# Patient Record
Sex: Male | Born: 1990
Health system: Southern US, Community
[De-identification: ages and names within clinical notes are randomized; demographics above are authoritative.]

## PROBLEM LIST (undated history)

## (undated) DIAGNOSIS — S82209A Unspecified fracture of shaft of unspecified tibia, initial encounter for closed fracture: Secondary | ICD-10-CM

## (undated) DIAGNOSIS — F909 Attention-deficit hyperactivity disorder, unspecified type: Secondary | ICD-10-CM

## (undated) HISTORY — DX: Unspecified fracture of shaft of unspecified tibia, initial encounter for closed fracture: S82.209A

## (undated) HISTORY — PX: INGUINAL HERNIA REPAIR: SHX194

## (undated) HISTORY — DX: Attention-deficit hyperactivity disorder, unspecified type: F90.9

## (undated) HISTORY — PX: WISDOM TOOTH EXTRACTION: SHX21

---

## 2011-03-10 ENCOUNTER — Ambulatory Visit (INDEPENDENT_AMBULATORY_CARE_PROVIDER_SITE_OTHER): Payer: BC Managed Care – PPO | Admitting: Family Medicine

## 2011-03-10 ENCOUNTER — Encounter: Payer: Self-pay | Admitting: Family Medicine

## 2011-03-10 ENCOUNTER — Telehealth: Payer: Self-pay | Admitting: Family Medicine

## 2011-03-10 VITALS — BP 144/90 | HR 76 | Temp 98.4°F | Ht 67.5 in | Wt 158.0 lb

## 2011-03-10 DIAGNOSIS — H60509 Unspecified acute noninfective otitis externa, unspecified ear: Secondary | ICD-10-CM | POA: Insufficient documentation

## 2011-03-10 DIAGNOSIS — H60399 Other infective otitis externa, unspecified ear: Secondary | ICD-10-CM

## 2011-03-10 MED ORDER — OFLOXACIN 0.3 % OT SOLN: OTIC | Status: DC

## 2011-03-10 NOTE — Assessment & Plan Note (Signed)
Start floxin otic 10 drops once daily x 10d.  Take tylenol 1000 mg q6h prn.

## 2011-03-10 NOTE — Progress Notes (Signed)
Office Note 03/10/2011  CC:  Chief Complaint  Patient presents with  . new patient- right ear pain x 5 days    HPI:  Lawrence Mendoza is a 20 y.o. White male who is here to establish care and discuss right ear pain. Patient's most recent primary MD: [facility] in [place], [place] (last visit there was years ago per pt report). Old records were not reviewed prior to or during today's visit.  Pt complains of several days of right ear pain, was intermittent but now persistent/constant.  No drainage.  No nasal congestion or runny nose, no sneezing, no cough.  Has some intermittent frontal HAs that he's not sure are related to his ear sx's.  No ST but hurts about on the right side of neck near the angle of his jaw.   No fevers.  Took tylenol this am and it helped temporarily.  Past Medical History  Diagnosis Date  . ADHD (attention deficit hyperactivity disorder)     as school age child, was on adderall xr until age 76.    Past Surgical History  Procedure Date  . Inguinal hernia repair     Right side--as an infant.    Family History  Problem Relation Age of Onset  . Hyperlipidemia Father     History   Social History  . Marital Status: Single    Spouse Name: N/A    Number of Children: N/A  . Years of Education: N/A   Occupational History  . Not on file.   Social History Main Topics  . Smoking status: Former Games developer  . Smokeless tobacco: Not on file  . Alcohol Use: Not on file  . Drug Use: Not on file  . Sexually Active: Not on file   Other Topics Concern  . Not on file   Social History Narrative   Single, no children.Attends [employer] in [place].Works at [employer]Smoked cigarettes for a few years on and off, quit for good 07/2010.No alcohol or drug use.    Outpatient Encounter Prescriptions as of 03/10/2011  Medication Sig Dispense Refill  . ofloxacin (FLOXIN) 0.3 % otic solution 10 drops in right ear once daily for 10 days  10 mL  1      No Known Allergies  ROS Review of Systems  Constitutional: Negative for fever, chills, appetite change and fatigue.  HENT: Negative for ear pain, congestion, sore throat, neck stiffness and dental problem.   Eyes: Negative for discharge, redness and visual disturbance.  Respiratory: Negative for cough, chest tightness, shortness of breath and wheezing.   Cardiovascular: Negative for chest pain, palpitations and leg swelling.  Gastrointestinal: Negative for nausea, vomiting, abdominal pain, diarrhea and blood in stool.  Genitourinary: Negative for dysuria, urgency, frequency, hematuria, flank pain and difficulty urinating.  Musculoskeletal: Negative for myalgias, back pain, joint swelling and arthralgias.  Skin: Negative for pallor and rash.       He says a few of his moles in the neck area bother him --his shirts irritate them and he is embarrassed by their appearance.  Neurological: Negative for dizziness, speech difficulty, weakness and headaches.  Hematological: Negative for adenopathy. Does not bruise/bleed easily.  Psychiatric/Behavioral: Negative for confusion and sleep disturbance. The patient is not nervous/anxious.     PE; Blood pressure 144/90, pulse 76, temperature 98.4 F (36.9 C), temperature source Oral, height 5' 7.5" (1.715 m), weight 158 lb (71.668 kg). Gen: Alert, well appearing.  Patient is oriented to person, place, time, and situation. HEENT:  Scalp without lesions or hair loss.  Ears: Left side EAC clear, normal epithelium, TM without erythema or bulging.  Right side: TTP near tragus and with insertion of ear speculum.  Right EAC erythematous and this extends down to the base of the TM.  The TM is a bit hazy but no erythema and no bulging.  Eyes: no injection, icteris, swelling, or exudate.  EOMI, PERRLA. Nose: no drainage or turbinate edema/swelling.  No injection or focal lesion.  Mouth: lips without lesion/swelling.  Oral mucosa pink and moist.  Dentition intact  and without obvious caries or gingival swelling.  Oropharynx without erythema, exudate, or swelling.  Neck: supple, ROM full.  No lymphadenopathy, thyromegaly, or mass.  Right side mildly tender near angle of mandible--no mass palpable.  Skin is normal here.  Pertinent labs:  none  ASSESSMENT AND PLAN:  New patient:   Acute otitis externa Start floxin otic 10 drops once daily x 10d.  Take tylenol 1000 mg q6h prn.      Return for make 30 min appt for CPE at your convenience.  Will evaluate his moles and come up with a plan for removal of the ones that bother him (or if any look atypical).

## 2011-03-10 NOTE — Telephone Encounter (Signed)
Pls request records from [facility] practice in [place]. Thx--PM.

## 2011-04-17 NOTE — Telephone Encounter (Signed)
Faxed 04/17/11 °

## 2011-05-11 ENCOUNTER — Ambulatory Visit (INDEPENDENT_AMBULATORY_CARE_PROVIDER_SITE_OTHER): Payer: BC Managed Care – PPO | Admitting: Family Medicine

## 2011-05-11 ENCOUNTER — Encounter: Payer: Self-pay | Admitting: Family Medicine

## 2011-05-11 VITALS — BP 124/82 | HR 78 | Temp 98.2°F | Ht 67.5 in | Wt 157.0 lb

## 2011-05-11 DIAGNOSIS — Z0000 Encounter for general adult medical examination without abnormal findings: Secondary | ICD-10-CM

## 2011-05-11 NOTE — Progress Notes (Signed)
Office Note 05/11/2011  CC:  Chief Complaint  Patient presents with  . Annual Exam    no problems    HPI:  Lawrence Mendoza is a 20 y.o. White male who is here for CPE. No complaints.  Outer ear infection he had 6+ wks ago cleared up quickly with drops.  Has a few moles that are around his neck and on his back that he wants removed.  These have not changed any recently, they just bother him/embarrass him.  Eats lots of fruits/veggies, exercises daily.   Past Medical History  Diagnosis Date  . ADHD (attention deficit hyperactivity disorder)     as school age child, was on adderall xr until age 83.    Past Surgical History  Procedure Date  . Inguinal hernia repair     Right side--as an infant.    Family History  Problem Relation Age of Onset  . Hyperlipidemia Father   . Hyperlipidemia Paternal Grandfather     History   Social History  . Marital Status: Single    Spouse Name: N/A    Number of Children: N/A  . Years of Education: N/A   Occupational History  . Not on file.   Social History Main Topics  . Smoking status: Former Games developer  . Smokeless tobacco: Not on file  . Alcohol Use: Not on file  . Drug Use: Not on file  . Sexually Active: Not on file   Other Topics Concern  . Not on file   Social History Narrative   Single, no children.Attends [employer] in [employer].Works at [employer]Smoked cigarettes for a few years on and off, quit for good 07/2010.No alcohol or drug use.   CURRENT MEDS: none Outpatient Prescriptions Prior to Visit  Medication Sig Dispense Refill  . ofloxacin (FLOXIN) 0.3 % otic solution 10 drops in right ear once daily for 10 days  10 mL  1    No Known Allergies  ROS Review of Systems  Constitutional: Negative for fever, chills, appetite change and fatigue.  HENT: Negative for ear pain, congestion, sore throat, neck stiffness and dental problem.   Eyes: Negative for discharge, redness and  visual disturbance.  Respiratory: Negative for cough, chest tightness, shortness of breath and wheezing.   Cardiovascular: Negative for chest pain, palpitations and leg swelling.  Gastrointestinal: Negative for nausea, vomiting, abdominal pain, diarrhea and blood in stool.  Genitourinary: Negative for dysuria, urgency, frequency, hematuria, flank pain and difficulty urinating.  Musculoskeletal: Negative for myalgias, back pain, joint swelling and arthralgias.  Skin: Negative for pallor and rash.  Neurological: Negative for dizziness, speech difficulty, weakness and headaches.  Hematological: Negative for adenopathy. Does not bruise/bleed easily.  Psychiatric/Behavioral: Negative for confusion and sleep disturbance. The patient is not nervous/anxious.     PE; Blood pressure 124/82, pulse 78, temperature 98.2 F (36.8 C), temperature source Oral, height 5' 7.5" (1.715 m), weight 157 lb (71.215 kg), SpO2 98.00%. Gen: Alert, well appearing.  Patient is oriented to person, place, time, and situation. HEENT: Scalp without lesions or hair loss.  Ears: EACs clear, normal epithelium.  TMs with good light reflex and landmarks bilaterally.  Eyes: no injection, icteris, swelling, or exudate.  EOMI, PERRLA. Nose: no drainage or turbinate edema/swelling.  No injection or focal lesion.  Mouth: lips without lesion/swelling.  Oral mucosa pink and moist.  Dentition intact and without obvious caries or gingival swelling.  Oropharynx without erythema, exudate, or swelling.  Neck: supple, ROM full.  Carotids 2+  bilat, without bruit.  No lymphadenopathy, thyromegaly, or mass. Chest: symmetric expansion, nonlabored respirations.  Clear and equal breath sounds in all lung fields.   CV: RRR, no m/r/g.  Peripheral pulses 2+ and symmetric. ABD: soft, NT, ND, BS normal.  No hepatospenomegaly or mass.  No bruits. EXT: no clubbing, cyanosis, or edema.  Genitals normal; both testes normal without tenderness, masses,  hydroceles, varicoceles, erythema or swelling. Shaft normal, circumcised, meatus normal without discharge. No inguinal hernia noted. No inguinal lymphadenopathy. SKIN: multiple small brown nevi around base of neck and on back.  No atypical appearance to any of these.  Pertinent labs:  none  ASSESSMENT AND PLAN:   Health maintenance examination Healthy young adult, doing well. Reviewed age and gender appropriate health maintenance issues (prudent diet, regular exercise, health risks of tobacco and excessive alcohol, use of seatbelts, fire alarms in home, use of sunscreen).  Also reviewed age and gender appropriate health screening as well as vaccine recommendations. He declined flu vaccine today.  Last tetanus was a few years ago in HS.   For his moles, I recommended he see a dermatologist for consideration of removal of these. He will call early 2013 when he is ready for his skin doctor referral.  FOLLOW UP:  Return in about 2 years (around 05/10/2013) for cpe.

## 2011-05-11 NOTE — Assessment & Plan Note (Signed)
Healthy young adult, doing well. Reviewed age and gender appropriate health maintenance issues (prudent diet, regular exercise, health risks of tobacco and excessive alcohol, use of seatbelts, fire alarms in home, use of sunscreen).  Also reviewed age and gender appropriate health screening as well as vaccine recommendations. He declined flu vaccine today.  Last tetanus was a few years ago in HS.

## 2012-08-30 ENCOUNTER — Ambulatory Visit (INDEPENDENT_AMBULATORY_CARE_PROVIDER_SITE_OTHER): Payer: BC Managed Care – PPO | Admitting: Internal Medicine

## 2012-08-30 ENCOUNTER — Encounter: Payer: Self-pay | Admitting: Internal Medicine

## 2012-08-30 VITALS — BP 130/78 | HR 77 | Temp 97.8°F | Ht 67.5 in | Wt 161.0 lb

## 2012-08-30 DIAGNOSIS — J209 Acute bronchitis, unspecified: Secondary | ICD-10-CM

## 2012-08-30 MED ORDER — AZITHROMYCIN 250 MG PO TABS: ORAL_TABLET | ORAL | Status: DC

## 2012-08-30 NOTE — Patient Instructions (Signed)

## 2012-08-30 NOTE — Progress Notes (Signed)
HPI  Pt presents to the clinic today with c/o cough x 3 weeks. He has been running fevers and producing thick green sputum. He has not taken anything OTC for his symptoms. The sputum seems to be getting thicker. He does not have any history of allergies or asthma. He has had sick contacts.  Review of Systems    Past Medical History  Diagnosis Date  . ADHD (attention deficit hyperactivity disorder)     as school age child, was on adderall xr until age 22.    Family History  Problem Relation Age of Onset  . Hyperlipidemia Father   . Hyperlipidemia Paternal Grandfather     History   Social History  . Marital Status: Single    Spouse Name: N/A    Number of Children: N/A  . Years of Education: N/A   Occupational History  . Not on file.   Social History Main Topics  . Smoking status: Former Games developer  . Smokeless tobacco: Not on file  . Alcohol Use: Not on file  . Drug Use: Not on file  . Sexually Active: Not on file   Other Topics Concern  . Not on file   Social History Narrative   Single, no children.Attends [employer] in [employer].Works at [employer]Smoked cigarettes for a few years on and off, quit for good 07/2010.No alcohol or drug use.    No Known Allergies   Constitutional: Positive fatigue and fever. Denies headache, abrupt weight changes.  HEENT: . Denies eye redness, ear pain, ringing in the ears, wax buildup, runny nose or bloody nose. Respiratory: Positive cough and thick green sputum production. Denies difficulty breathing or shortness of breath.  Cardiovascular: Denies chest pain, chest tightness, palpitations or swelling in the hands or feet.   No other specific complaints in a complete review of systems (except as listed in HPI above).  Objective:    BP 130/78  Pulse 77  Temp 97.8 F (36.6 C) (Oral)  Ht 5' 7.5" (1.715 m)  Wt 161 lb (73.029 kg)  BMI 24.84 kg/m2  SpO2 97% Wt Readings from Last 3 Encounters:   08/30/12 161 lb (73.029 kg)  05/11/11 157 lb (71.215 kg)  03/10/11 158 lb (71.668 kg)    General: Appears his stated age, well developed, well nourished in NAD. HEENT: Head: normal shape and size; Eyes: sclera white, no icterus, conjunctiva pink, PERRLA and EOMs intact; Ears: Tm's gray and intact, normal light reflex; Nose: mucosa pink and moist, septum midline; Throat/Mouth: + PND. Teeth present, mucosa pink and moist, no exudate noted, no lesions or ulcerations noted.  Neck: Mild cervical lymphadenopathy. Neck supple, trachea midline. No massses, lumps or thyromegaly present.  Cardiovascular: Normal rate and rhythm. S1,S2 noted.  No murmur, rubs or gallops noted. No JVD or BLE edema. No carotid bruits noted. Pulmonary/Chest: Normal effort and fine ronchi throughout. No respiratory distress. No wheezes, rales noted.      Assessment & Plan:   Acute bronchitis, new onset with additional workup required:  Get some rest and stay well hydrated Azithromax x 5 days  RTC as needed or if symptoms persist.

## 2013-05-12 ENCOUNTER — Encounter: Payer: BC Managed Care – PPO | Admitting: Family Medicine

## 2013-10-22 DIAGNOSIS — S82209A Unspecified fracture of shaft of unspecified tibia, initial encounter for closed fracture: Secondary | ICD-10-CM

## 2013-10-22 HISTORY — DX: Unspecified fracture of shaft of unspecified tibia, initial encounter for closed fracture: S82.209A

## 2013-10-23 ENCOUNTER — Ambulatory Visit (INDEPENDENT_AMBULATORY_CARE_PROVIDER_SITE_OTHER): Payer: 59 | Admitting: Family Medicine

## 2013-10-23 ENCOUNTER — Telehealth: Payer: Self-pay | Admitting: Family Medicine

## 2013-10-23 ENCOUNTER — Encounter: Payer: Self-pay | Admitting: Family Medicine

## 2013-10-23 VITALS — BP 131/87 | HR 99 | Temp 100.2°F | Resp 18 | Ht 68.0 in | Wt 168.0 lb

## 2013-10-23 DIAGNOSIS — J029 Acute pharyngitis, unspecified: Secondary | ICD-10-CM

## 2013-10-23 LAB — POCT RAPID STREP A (OFFICE): RAPID STREP A SCREEN: NEGATIVE

## 2013-10-23 MED ORDER — AMOXICILLIN 875 MG PO TABS: 875.0000 mg | ORAL_TABLET | Freq: Two times a day (BID) | ORAL | Status: AC

## 2013-10-23 NOTE — Progress Notes (Signed)
Pre visit review using our clinic review tool, if applicable. No additional management support is needed unless otherwise documented below in the visit note. 

## 2013-10-23 NOTE — Progress Notes (Signed)
OFFICE NOTE  10/23/2013  CC:  Chief Complaint  Patient presents with  . Sore Throat    x Tuesday  . Fever  . Nasal Congestion     HPI: Patient is a 23 y.o. Caucasian male who is here for ST. Onset 2 d/a, some nasal congestion/mucous with it, some cough, stomach queezy, fever the first night.  No rash, no diarrhea.  Taking advil 600mg  twice daily and nyquil.  Generalized fatigue but no aches.   Pertinent PMH:  Past medical, surgical, social, and family history reviewed and no changes are noted since last office visit.  MEDS:  As per HPI  PE: Blood pressure 131/87, pulse 99, temperature 100.2 F (37.9 C), temperature source Temporal, resp. rate 18, height 5\' 8"  (1.727 m), weight 168 lb (76.204 kg), SpO2 99.00%. Gen: Alert, tired appearing but nontoxic. VS: noted--normal. Gen: alert, NAD, NONTOXIC APPEARING. HEENT: eyes without injection, drainage, or swelling.  Ears: EACs clear, TMs with mild dullness/loss of landmarks with minimal erythema/injection.  No bulging, no obvious middle ear fluid noted. Nose: Scant clear rhinorrhea, with no significant mucosal edema or injection.  No paranasal sinus TTP.  No facial swelling.  Throat and mouth without focal lesion.  +Diffuse mild pharyngeal swelling, symmetric tonsillar enlargement and erythema, mild exudate in tonsillar crypts.   Neck: supple, with L>R anterior cervical LAD, no signif TTP   LUNGS: CTA bilat, nonlabored resps.   CV: RRR, no m/r/g. EXT: no c/c/e SKIN: no rash  LAB: rapid strep NEG today  IMPRESSION AND PLAN:  Acute pharyngitis, fever. Even though rapid strep neg, suspicious for streptococcal pharyngitis. Will send throat culture and start amoxil 875mg  bid x 10d. Signs/symptoms to call or return for were reviewed and pt expressed understanding.  An After Visit Summary was printed and given to the patient.  FOLLOW UP: prn

## 2013-10-23 NOTE — Telephone Encounter (Signed)
Please write a note to excuse him from work for yesterday & today. Patient will pick up this morning

## 2013-10-25 LAB — CULTURE, GROUP A STREP: ORGANISM ID, BACTERIA: NORMAL

## 2013-10-31 ENCOUNTER — Encounter: Payer: Self-pay | Admitting: Family Medicine

## 2014-07-13 ENCOUNTER — Encounter: Payer: Self-pay | Admitting: Family Medicine

## 2014-07-13 ENCOUNTER — Ambulatory Visit (INDEPENDENT_AMBULATORY_CARE_PROVIDER_SITE_OTHER): Payer: 59 | Admitting: Family Medicine

## 2014-07-13 ENCOUNTER — Telehealth: Payer: Self-pay | Admitting: Family Medicine

## 2014-07-13 VITALS — BP 136/89 | HR 71 | Temp 99.2°F | Resp 16 | Ht 68.0 in | Wt 170.0 lb

## 2014-07-13 DIAGNOSIS — D229 Melanocytic nevi, unspecified: Secondary | ICD-10-CM

## 2014-07-13 DIAGNOSIS — D239 Other benign neoplasm of skin, unspecified: Secondary | ICD-10-CM

## 2014-07-13 DIAGNOSIS — Z0000 Encounter for general adult medical examination without abnormal findings: Secondary | ICD-10-CM

## 2014-07-13 NOTE — Assessment & Plan Note (Signed)
Reviewed age and gender appropriate health maintenance issues (prudent diet, regular exercise, health risks of tobacco and excessive alcohol, use of seatbelts, fire alarms in home, use of sunscreen).  Also reviewed age and gender appropriate health screening as well as vaccine recommendations. No labs indicated. He declined flu vaccine. Td UTD. Will refer to dermatology for general annual skin exam since he has quite a few moles, one on right low back that is very large.

## 2014-07-13 NOTE — Progress Notes (Signed)
Office Note 07/13/2014  CC:  Chief Complaint  Patient presents with  . Annual Exam   HPI:  Lawrence Mendoza is a 23 y.o. White male who is here for CPE.   Doesn't workout but walks a lot on his job. Dietary habits are decent. Working long hours on his new job.  Past Medical History  Diagnosis Date  . ADHD (attention deficit hyperactivity disorder)     as school age child, was on adderall xr until age 12.  . Tibia fracture 10/2013    compound, not displaced.  No surgery.    Past Surgical History  Procedure Laterality Date  . Inguinal hernia repair      Right side--as an infant.  . Wisdom tooth extraction      Family History  Problem Relation Age of Onset  . Hyperlipidemia Father   . Hypertension Father   . Hyperlipidemia Paternal Grandfather     History   Social History  . Marital Status: Single    Spouse Name: N/A    Number of Children: N/A  . Years of Education: N/A   Occupational History  . Not on file.   Social History Main Topics  . Smoking status: Current Some Day Smoker  . Smokeless tobacco: Former Systems developer  . Alcohol Use: Yes     Comment: socially  . Drug Use: No  . Sexual Activity: Not on file   Other Topics Concern  . Not on file   Social History Narrative   Single, no children.   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   Lives in [place], [place].   Smoked cigarettes for a few years on and off, quit for good 07/2010.   No alcohol or drug use.   MEDS: none  No Known Allergies  ROS Review of Systems  Constitutional: Negative for fever, chills, appetite change and fatigue.  HENT: Negative for congestion, dental problem, ear pain and sore throat.   Eyes: Negative for discharge, redness and visual disturbance.  Respiratory: Negative for cough, chest tightness, shortness of breath and wheezing.   Cardiovascular: Negative for chest pain, palpitations and leg swelling.  Gastrointestinal: Negative for nausea,  vomiting, abdominal pain, diarrhea and blood in stool.  Genitourinary: Negative for dysuria, urgency, frequency, hematuria, flank pain and difficulty urinating.  Musculoskeletal: Negative for myalgias, back pain, joint swelling, arthralgias and neck stiffness.  Skin: Negative for pallor and rash.  Neurological: Negative for dizziness, speech difficulty, weakness and headaches.  Hematological: Negative for adenopathy. Does not bruise/bleed easily.  Psychiatric/Behavioral: Negative for confusion and sleep disturbance. The patient is not nervous/anxious.     PE; Blood pressure 136/89, pulse 71, temperature 99.2 F (37.3 C), temperature source Temporal, resp. rate 16, height 5\' 8"  (1.727 m), weight 170 lb (77.111 kg), SpO2 100 %. Gen: Alert, well appearing.  Patient is oriented to person, place, time, and situation. AFFECT: pleasant, lucid thought and speech. ENT: Ears: EACs clear, normal epithelium.  TMs with good light reflex and landmarks bilaterally.  Eyes: no injection, icteris, swelling, or exudate.  EOMI, PERRLA. Nose: no drainage or turbinate edema/swelling.  No injection or focal lesion.  Mouth: lips without lesion/swelling.  Oral mucosa pink and moist.  Dentition intact and without obvious caries or gingival swelling.  Oropharynx without erythema, exudate, or swelling.  Neck: supple/nontender.  No LAD, mass, or TM.  Carotid pulses 2+ bilaterally, without bruits. CV: RRR, no m/r/g.   LUNGS: CTA bilat, nonlabored resps, good aeration in all lung  fields. ABD: soft, NT, ND, BS normal.  No hepatospenomegaly or mass.  No bruits. EXT: no clubbing, cyanosis, or edema.  Musculoskeletal: no joint swelling, erythema, warmth, or tenderness.  ROM of all joints intact. Skin - no sores or rashes or color changes. Many scattered nevi that have no atypical appearances.  One large nevus on R low back area with lighter brown outer pigment, darker central pigment--patient states this has been a consistent  appearance and has grown in proportion to his body growth for about 10 yrs.  Pertinent labs:  none  ASSESSMENT AND PLAN:   Health maintenance examination Reviewed age and gender appropriate health maintenance issues (prudent diet, regular exercise, health risks of tobacco and excessive alcohol, use of seatbelts, fire alarms in home, use of sunscreen).  Also reviewed age and gender appropriate health screening as well as vaccine recommendations. No labs indicated. He declined flu vaccine. Td UTD. Will refer to dermatology for general annual skin exam since he has quite a few moles, one on right low back that is very large.  An After Visit Summary was printed and given to the patient.  FOLLOW UP:  Return for annual CPE (fasting).

## 2014-07-13 NOTE — Telephone Encounter (Signed)
emmi mailed  °

## 2014-07-13 NOTE — Progress Notes (Signed)
Pre visit review using our clinic review tool, if applicable. No additional management support is needed unless otherwise documented below in the visit note. 

## 2014-07-14 ENCOUNTER — Encounter: Payer: Self-pay | Admitting: Family Medicine

## 2014-12-25 ENCOUNTER — Other Ambulatory Visit (HOSPITAL_COMMUNITY): Payer: Self-pay | Admitting: Family Medicine

## 2014-12-25 DIAGNOSIS — M542 Cervicalgia: Secondary | ICD-10-CM

## 2015-01-08 ENCOUNTER — Ambulatory Visit (HOSPITAL_COMMUNITY): Payer: BLUE CROSS/BLUE SHIELD | Attending: Family Medicine

## 2015-07-15 ENCOUNTER — Encounter: Payer: 59 | Admitting: Family Medicine

## 2018-05-24 ENCOUNTER — Ambulatory Visit: Payer: BLUE CROSS/BLUE SHIELD | Admitting: Family

## 2018-05-24 ENCOUNTER — Encounter: Payer: Self-pay | Admitting: Family

## 2018-05-24 VITALS — BP 128/83 | HR 60 | Temp 98.5°F | Ht 67.5 in | Wt 164.8 lb

## 2018-05-24 DIAGNOSIS — F17200 Nicotine dependence, unspecified, uncomplicated: Secondary | ICD-10-CM

## 2018-05-24 DIAGNOSIS — Z0000 Encounter for general adult medical examination without abnormal findings: Secondary | ICD-10-CM

## 2018-05-24 NOTE — Patient Instructions (Signed)
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
## 2018-05-25 LAB — CMP14+EGFR
ALT: 10 IU/L (ref 0–44)
AST: 12 IU/L (ref 0–40)
Albumin/Globulin Ratio: 2.1 (ref 1.2–2.2)
Albumin: 4.8 g/dL (ref 3.5–5.5)
Alkaline Phosphatase: 90 IU/L (ref 39–117)
BUN/Creatinine Ratio: 14 (ref 9–20)
BUN: 12 mg/dL (ref 6–20)
Bilirubin Total: 0.4 mg/dL (ref 0.0–1.2)
CALCIUM: 10.1 mg/dL (ref 8.7–10.2)
CO2: 24 mmol/L (ref 20–29)
CREATININE: 0.88 mg/dL (ref 0.76–1.27)
Chloride: 103 mmol/L (ref 96–106)
GFR, EST AFRICAN AMERICAN: 136 mL/min/{1.73_m2} (ref >59)
GFR, EST NON AFRICAN AMERICAN: 118 mL/min/{1.73_m2} (ref >59)
GLOBULIN, TOTAL: 2.3 g/dL (ref 1.5–4.5)
Glucose: 81 mg/dL (ref 65–99)
Potassium: 4.3 mmol/L (ref 3.5–5.2)
Sodium: 144 mmol/L (ref 134–144)
TOTAL PROTEIN: 7.1 g/dL (ref 6.0–8.5)

## 2018-05-25 LAB — CBC WITH DIFFERENTIAL/PLATELET
Basophils Absolute: 0 10*3/uL (ref 0.0–0.2)
Basos: 1 %
EOS (ABSOLUTE): 0.3 10*3/uL (ref 0.0–0.4)
EOS: 4 %
HEMATOCRIT: 41.3 % (ref 37.5–51.0)
HEMOGLOBIN: 14.5 g/dL (ref 13.0–17.7)
IMMATURE GRANS (ABS): 0 10*3/uL (ref 0.0–0.1)
IMMATURE GRANULOCYTES: 0 %
LYMPHS: 42 %
Lymphocytes Absolute: 3 10*3/uL (ref 0.7–3.1)
MCH: 29.4 pg (ref 26.6–33.0)
MCHC: 35.1 g/dL (ref 31.5–35.7)
MCV: 84 fL (ref 79–97)
Monocytes Absolute: 0.8 10*3/uL (ref 0.1–0.9)
Monocytes: 12 %
NEUTROS PCT: 41 %
Neutrophils Absolute: 2.8 10*3/uL (ref 1.4–7.0)
Platelets: 282 10*3/uL (ref 150–450)
RBC: 4.94 x10E6/uL (ref 4.14–5.80)
RDW: 12.2 % — ABNORMAL LOW (ref 12.3–15.4)
WBC: 7 10*3/uL (ref 3.4–10.8)

## 2018-05-25 LAB — LIPID PANEL
CHOL/HDL RATIO: 3.3 ratio (ref 0.0–5.0)
Cholesterol, Total: 163 mg/dL (ref 100–199)
HDL: 50 mg/dL (ref >39)
LDL CALC: 105 mg/dL — AB (ref 0–99)
Triglycerides: 40 mg/dL (ref 0–149)
VLDL CHOLESTEROL CAL: 8 mg/dL (ref 5–40)

## 2018-05-25 LAB — TSH: TSH: 1.09 u[IU]/mL (ref 0.450–4.500)

## 2018-06-11 ENCOUNTER — Encounter: Payer: Self-pay | Admitting: Family Medicine

## 2018-06-11 ENCOUNTER — Ambulatory Visit: Payer: BLUE CROSS/BLUE SHIELD | Admitting: Family Medicine

## 2018-06-11 ENCOUNTER — Ambulatory Visit (INDEPENDENT_AMBULATORY_CARE_PROVIDER_SITE_OTHER): Payer: BLUE CROSS/BLUE SHIELD

## 2018-06-11 VITALS — BP 132/82 | HR 69 | Temp 99.4°F | Ht 67.5 in | Wt 164.0 lb

## 2018-06-11 DIAGNOSIS — R1084 Generalized abdominal pain: Secondary | ICD-10-CM

## 2018-06-11 DIAGNOSIS — K5903 Drug induced constipation: Secondary | ICD-10-CM

## 2018-06-11 DIAGNOSIS — K5900 Constipation, unspecified: Secondary | ICD-10-CM | POA: Diagnosis not present

## 2018-06-11 NOTE — Progress Notes (Addendum)
Subjective:    Patient ID: Lawrence Mendoza, male    DOB: 12-Mar-1991, 27 y.o.   MRN: [id number]  Chief Complaint:  Abdominal Pain (history of constipation and gas; taking Probiotic daily)   HPI: Lawrence Mendoza is a 27 y.o. male presenting on 06/11/2018 for Abdominal Pain (history of constipation and gas; taking Probiotic daily)  Pt presents today with general abdominal cramping and constipation. States he has had intermittent constipation for 2 years. States he treats it intermittently with over the counter stool softeners and stimulant laxatives. States he took a laxative today with minimal results. He does take a probiotic daily with minimal relief. States when he has a bowel movement they are small and formed. States he usually has a small bowel movement every other day. States today he has had rectal pressure and urge to defecate, but is unable. Denies blood in stool or hemorrhoids. He reports the cramoing is a 5/10 when present and only lasts a brief amount of time. Denies radiation of pain.  He has been on methadone for 2 years and this problem started after the initiation of methadone.   Relevant past medical, surgical, family, and social history reviewed and updated as indicated.  Allergies and medications reviewed and updated.   Past Medical History:  Diagnosis Date  . ADHD (attention deficit hyperactivity disorder)    as school age child, was on adderall xr until age 62.  . Tibia fracture 10/2013   compound, not displaced.  No surgery.    Past Surgical History:  Procedure Laterality Date  . INGUINAL HERNIA REPAIR     Right side--as an infant.  . WISDOM TOOTH EXTRACTION      Social History   Socioeconomic History  . Marital status: Single    Spouse name: Not on file  . Number of children: Not on file  . Years of education: Not on file  . Highest education level: Not on file  Occupational History  . Not on file  Social Needs  . Financial resource strain: Not  on file  . Food insecurity:    Worry: Not on file    Inability: Not on file  . Transportation needs:    Medical: Not on file    Non-medical: Not on file  Tobacco Use  . Smoking status: Current Some Day Smoker  . Smokeless tobacco: Former Network engineer and Sexual Activity  . Alcohol use: Yes    Comment: socially  . Drug use: No  . Sexual activity: Not on file  Lifestyle  . Physical activity:    Days per week: Not on file    Minutes per session: Not on file  . Stress: Not on file  Relationships  . Social connections:    Talks on phone: Not on file    Gets together: Not on file    Attends religious service: Not on file    Active member of club or organization: Not on file    Attends meetings of clubs or organizations: Not on file    Relationship status: Not on file  . Intimate partner violence:    Fear of current or ex partner: Not on file    Emotionally abused: Not on file    Physically abused: Not on file    Forced sexual activity: Not on file  Other Topics Concern  . Not on file  Social History Narrative   Single, no children.   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   Lives in [place], [place].   Smoked cigarettes for a few years on and off, quit for good 07/2010.   No alcohol or drug use.    Outpatient Encounter Medications as of 06/11/2018  Medication Sig  . METHADONE HCL PO Take by mouth.   No facility-administered encounter medications on file as of 06/11/2018.     No Known Allergies  Review of Systems  Constitutional: Positive for appetite change. Negative for chills, fatigue and fever.  Respiratory: Negative for shortness of breath.   Cardiovascular: Negative for chest pain.  Gastrointestinal: Positive for abdominal pain, constipation and nausea. Negative for abdominal distention, anal bleeding, blood in stool, diarrhea, rectal pain and vomiting.  Genitourinary: Negative for decreased urine volume and difficulty  urinating.  Neurological: Negative for dizziness, syncope and headaches.  All other systems reviewed and are negative.       Objective:    BP 132/82   Pulse 69   Temp 99.4 F (37.4 C) (Oral)   Ht 5' 7.5" (1.715 m)   Wt 164 lb (74.4 kg)   BMI 25.31 kg/m    Wt Readings from Last 3 Encounters:  06/11/18 164 lb (74.4 kg)  05/24/18 164 lb 12.8 oz (74.8 kg)  07/13/14 170 lb (77.1 kg)    Physical Exam  Constitutional: He is oriented to person, place, and time. He appears well-developed and well-nourished. He is cooperative. He does not appear ill.  HENT:  Head: Normocephalic and atraumatic.  Eyes: Pupils are equal, round, and reactive to light.  Cardiovascular: Normal rate, regular rhythm and normal heart sounds.  Pulmonary/Chest: Effort normal and breath sounds normal.  Abdominal: Soft. Normal appearance and bowel sounds are normal. He exhibits no distension. There is no hepatosplenomegaly. There is generalized tenderness. There is no rigidity, no rebound, no guarding, no CVA tenderness, no tenderness at McBurney's point and negative Murphy's sign. No hernia.  Neurological: He is alert and oriented to person, place, and time.  Skin: Skin is warm and dry. Capillary refill takes less than 2 seconds. No pallor.  Psychiatric: He has a normal mood and affect. His speech is normal and behavior is normal. Judgment and thought content normal. Cognition and memory are normal.  Nursing note and vitals reviewed.   Results for orders placed or performed in visit on 05/24/18  CMP14+EGFR  Result Value Ref Range   Glucose 81 65 - 99 mg/dL   BUN 12 6 - 20 mg/dL   Creatinine, Ser 0.88 0.76 - 1.27 mg/dL   GFR calc non Af Amer 118 >59 mL/min/1.73   GFR calc Af Amer 136 >59 mL/min/1.73   BUN/Creatinine Ratio 14 9 - 20   Sodium 144 134 - 144 mmol/L   Potassium 4.3 3.5 - 5.2 mmol/L   Chloride 103 96 - 106 mmol/L   CO2 24 20 - 29 mmol/L   Calcium 10.1 8.7 - 10.2 mg/dL   Total Protein 7.1 6.0 -  8.5 g/dL   Albumin 4.8 3.5 - 5.5 g/dL   Globulin, Total 2.3 1.5 - 4.5 g/dL   Albumin/Globulin Ratio 2.1 1.2 - 2.2   Bilirubin Total 0.4 0.0 - 1.2 mg/dL   Alkaline Phosphatase 90 39 - 117 IU/L   AST 12 0 - 40 IU/L   ALT 10 0 - 44 IU/L  CBC with Differential/Platelet  Result Value Ref Range   WBC 7.0 3.4 - 10.8 x10E3/uL   RBC 4.94 4.14 - 5.80 x10E6/uL   Hemoglobin 14.5 13.0 - 17.7  g/dL   Hematocrit 41.3 37.5 - 51.0 %   MCV 84 79 - 97 fL   MCH 29.4 26.6 - 33.0 pg   MCHC 35.1 31.5 - 35.7 g/dL   RDW 12.2 (L) 12.3 - 15.4 %   Platelets 282 150 - 450 x10E3/uL   Neutrophils 41 Not Estab. %   Lymphs 42 Not Estab. %   Monocytes 12 Not Estab. %   Eos 4 Not Estab. %   Basos 1 Not Estab. %   Neutrophils Absolute 2.8 1.4 - 7.0 x10E3/uL   Lymphocytes Absolute 3.0 0.7 - 3.1 x10E3/uL   Monocytes Absolute 0.8 0.1 - 0.9 x10E3/uL   EOS (ABSOLUTE) 0.3 0.0 - 0.4 x10E3/uL   Basophils Absolute 0.0 0.0 - 0.2 x10E3/uL   Immature Granulocytes 0 Not Estab. %   Immature Grans (Abs) 0.0 0.0 - 0.1 x10E3/uL  Lipid panel  Result Value Ref Range   Cholesterol, Total 163 100 - 199 mg/dL   Triglycerides 40 0 - 149 mg/dL   HDL 50 >39 mg/dL   VLDL Cholesterol Cal 8 5 - 40 mg/dL   LDL Calculated 105 (H) 0 - 99 mg/dL   Chol/HDL Ratio 3.3 0.0 - 5.0 ratio  TSH  Result Value Ref Range   TSH 1.090 0.450 - 4.500 uIU/mL   X-Ray: KUB: Constipation, no obvious obstruction. Preliminary x-ray reading by Monia Pouch, FNP-C, [facility].   Pertinent labs & imaging results that were available during my care of the patient were reviewed by me and considered in my medical decision making.  Assessment & Plan:  Lawrence Mendoza was seen today for abdominal pain.  Diagnoses and all orders for this visit:  Generalized abdominal pain Chronic Methadone use. Xray consistent with constipation. Pt will be notified if radiologist reading differs. Pt reluctant to try Movantik or Relistor. Will do Miralax clean out and then daily dosing of  Miralax. Pt to report any new or worsening symptoms. Instructions on clean out provided. -     DG Abd 1 View; Future  Drug-induced constipation Chronic Methadone use. Xray consistent with constipation. Pt reluctant to try Movantik or Relistor. Will do Miralax clean out and then daily dosing of Miralax. Pt to report any new or worsening symptoms. Instructions on clean out provided.  -     DG Abd 1 View; Future    Continue all other maintenance medications.  Follow up plan: Return in about 4 weeks (around 07/09/2018), or if symptoms worsen or fail to improve.  Educational handout given for constipation   The above assessment and management plan was discussed with the patient. The patient verbalized understanding of and has agreed to the management plan. Patient is aware to call the clinic if symptoms persist or worsen. Patient is aware when to return to the clinic for a follow-up visit. Patient educated on when it is appropriate to go to the emergency department.   Monia Pouch, FNP-C [facility] [phone]

## 2018-06-11 NOTE — Patient Instructions (Signed)
Thank you for coming in to clinic today.  1. Your symptoms are consistent with Constipation, likely cause of your General Abdominal Pain / Cramping. 2. Start with Miralax, prescription was sent to pharmacy. First dose 68g (4 capfuls) in 32oz water over 1 to 2 hours for clean out. Next day start 17g or 1 capful daily, may adjust dose up or down by half a capful every few days. Recommend to take this medicine daily for next 1-2 weeks, you may need to use it longer if needed. - Goal is to have soft regular bowel movement 1-3x daily, if too runny or diarrhea, then reduce dose of the medicine to every other day.  Improve water intake, hydration will help Also recommend increased vegetables, fruits, fiber intake Can try daily Metamucil or Fiber supplement at pharmacy over the counter  Follow-up if symptoms are not improving with bowel movements, or if pain worsens, develop fevers, nausea, vomiting.  Please schedule a follow-up appointment with Monia Pouch, FNP, in 1 month to follow-up Constipation  If you have any other questions or concerns, please feel free to call the clinic to contact me. You may also schedule an earlier appointment if necessary.  However, if your symptoms get significantly worse, please go to the Emergency Department to seek immediate medical attention.    Constipation, Adult Constipation is when a person:  Poops (has a bowel movement) fewer times in a week than normal.  Has a hard time pooping.  Has poop that is dry, hard, or bigger than normal.  Follow these instructions at home: Eating and drinking   Eat foods that have a lot of fiber, such as: ? Fresh fruits and vegetables. ? Whole grains. ? Beans.  Eat less of foods that are high in fat, low in fiber, or overly processed, such as: ? [place] fries. ? Hamburgers. ? Cookies. ? Candy. ? Soda.  Drink enough fluid to keep your pee (urine) clear or pale yellow. General instructions  Exercise regularly  or as told by your doctor.  Go to the restroom when you feel like you need to poop. Do not hold it in.  Take over-the-counter and prescription medicines only as told by your doctor. These include any fiber supplements.  Do pelvic floor retraining exercises, such as: ? Doing deep breathing while relaxing your lower belly (abdomen). ? Relaxing your pelvic floor while pooping.  Watch your condition for any changes.  Keep all follow-up visits as told by your doctor. This is important. Contact a doctor if:  You have pain that gets worse.  You have a fever.  You have not pooped for 4 days.  You throw up (vomit).  You are not hungry.  You lose weight.  You are bleeding from the anus.  You have thin, pencil-like poop (stool). Get help right away if:  You have a fever, and your symptoms suddenly get worse.  You leak poop or have blood in your poop.  Your belly feels hard or bigger than normal (is bloated).  You have very bad belly pain.  You feel dizzy or you faint. This information is not intended to replace advice given to you by your health care provider. Make sure you discuss any questions you have with your health care provider. Document Released: 12/27/2007 Document Revised: 01/28/2016 Document Reviewed: 12/29/2015 Elsevier Interactive Patient Education  2018 [employer].

## 2018-07-01 ENCOUNTER — Telehealth: Payer: Self-pay | Admitting: Family

## 2018-07-01 NOTE — Telephone Encounter (Signed)
Aware to come to office for letter.

## 2018-07-01 NOTE — Telephone Encounter (Signed)
Note ready for pick up

## 2018-10-09 ENCOUNTER — Encounter: Payer: Self-pay | Admitting: Family Medicine

## 2018-10-09 ENCOUNTER — Other Ambulatory Visit: Payer: Self-pay

## 2018-10-09 ENCOUNTER — Ambulatory Visit: Payer: BLUE CROSS/BLUE SHIELD | Admitting: Family Medicine

## 2018-10-09 VITALS — BP 128/81 | HR 83 | Temp 99.5°F | Ht 67.5 in | Wt 160.4 lb

## 2018-10-09 DIAGNOSIS — K529 Noninfective gastroenteritis and colitis, unspecified: Secondary | ICD-10-CM

## 2018-10-09 NOTE — Progress Notes (Signed)
Chief Complaint  Patient presents with  . Abdominal Pain    pt here today to get a note for work due to being out for N/V, diarrhea and headache which he no longer has    HPI  Patient presents today for symptoms of nausea vomiting and diarrhea.  This was present for 2 days ending yesterday.  He still has a bit of a headache as of earlier this morning.  He is otherwise symptom-free.  At this moment he is actually headache free.  He has had no cough.  No shortness of breath.    PMH: Smoking status noted ROS: Per HPI  Objective: BP 128/81   Pulse 83   Temp 99.5 F (37.5 C) (Oral)   Ht 5' 7.5" (1.715 m)   Wt 160 lb 6 oz (72.7 kg)   BMI 24.75 kg/m  Gen: NAD, alert, cooperative with exam HEENT: NCAT, EOMI, PERRL CV: RRR, good S1/S2, no murmur Resp: CTABL, no wheezes, non-labored Abd: SNTND, BS present, no guarding or organomegaly Ext: No edema, warm Neuro: Alert and oriented, No gross deficits  Assessment and plan:  1. Gastroenteritis, acute     Symptoms have essentially resolved other than a slight low-grade fever in this young man.  With a normal exam otherwise I think he is safe to go back tomorrow unless his fever returns.  Return to work note given.  History for influenza  Follow up as needed.  Claretta Fraise, MD

## 2018-12-23 ENCOUNTER — Ambulatory Visit (INDEPENDENT_AMBULATORY_CARE_PROVIDER_SITE_OTHER): Payer: BLUE CROSS/BLUE SHIELD | Admitting: Family Medicine

## 2018-12-23 ENCOUNTER — Encounter: Payer: Self-pay | Admitting: Family Medicine

## 2018-12-23 ENCOUNTER — Other Ambulatory Visit: Payer: Self-pay

## 2018-12-23 DIAGNOSIS — R197 Diarrhea, unspecified: Secondary | ICD-10-CM | POA: Diagnosis not present

## 2018-12-23 DIAGNOSIS — R110 Nausea: Secondary | ICD-10-CM

## 2018-12-23 MED ORDER — ONDANSETRON HCL 4 MG PO TABS: 4.0000 mg | ORAL_TABLET | Freq: Three times a day (TID) | ORAL | 0 refills | Status: DC | PRN

## 2018-12-23 NOTE — Progress Notes (Signed)
Virtual Visit via telephone Note Due to COVID-19, visit is conducted virtually and was requested by patient. This visit type was conducted due to national recommendations for restrictions regarding the COVID-19 Pandemic (e.g. social distancing) in an effort to limit this patient's exposure and mitigate transmission in our community. All issues noted in this document were discussed and addressed.  A physical exam was not performed with this format.   I connected with Lawrence Mendoza on 12/23/18 at 1415 by telephone and verified that I am speaking with the correct person using two identifiers. [name] is currently located at home and family is currently with them during visit. The provider, Monia Pouch, FNP is located in their office at time of visit.  I discussed the limitations, risks, security and privacy concerns of performing an evaluation and management service by telephone and the availability of in person appointments. I also discussed with the patient that there may be a patient responsible charge related to this service. The patient expressed understanding and agreed to proceed.  Subjective:  Patient ID: Lawrence Mendoza, male    DOB: [birth year], 28 y.o.   MRN: [id number]  Chief Complaint:  Diarrhea and Nausea   HPI: Lawrence Mendoza is a 28 y.o. male presenting on 12/23/2018 for Diarrhea and Nausea   Pt reports diarrhea and nausea. States he has been on methadone for a while and he has been cutting his dose back to try to come off of it. States he is followed by the pain clinic and they are helping him with this but they did not give him anything for the nausea and diarrhea. States the symptoms started this weekend. He has not tried anything for the symptoms.   Diarrhea   This is a new problem. The current episode started in the past 7 days. The problem occurs 2 to 4 times per day. The problem has been waxing and waning. The patient states that diarrhea does not awaken him  from sleep. Pertinent negatives include no abdominal pain, arthralgias, bloating, chills, coughing, fever, headaches, increased  flatus, myalgias, sweats, URI, vomiting or weight loss. Nothing aggravates the symptoms. He has tried nothing for the symptoms.     Relevant past medical, surgical, family, and social history reviewed and updated as indicated.  Allergies and medications reviewed and updated.   Past Medical History:  Diagnosis Date  . ADHD (attention deficit hyperactivity disorder)    as school age child, was on adderall xr until age 38.  . Tibia fracture 10/2013   compound, not displaced.  No surgery.    Past Surgical History:  Procedure Laterality Date  . INGUINAL HERNIA REPAIR     Right side--as an infant.  . WISDOM TOOTH EXTRACTION      Social History   Socioeconomic History  . Marital status: Single    Spouse name: Not on file  . Number of children: Not on file  . Years of education: Not on file  . Highest education level: Not on file  Occupational History  . Not on file  Social Needs  . Financial resource strain: Not on file  . Food insecurity:    Worry: Not on file    Inability: Not on file  . Transportation needs:    Medical: Not on file    Non-medical: Not on file  Tobacco Use  . Smoking status: Current Some Day Smoker  . Smokeless tobacco: Former Network engineer and Sexual Activity  . Alcohol use: Yes  Comment: socially  . Drug use: No  . Sexual activity: Not on file  Lifestyle  . Physical activity:    Days per week: Not on file    Minutes per session: Not on file  . Stress: Not on file  Relationships  . Social connections:    Talks on phone: Not on file    Gets together: Not on file    Attends religious service: Not on file    Active member of club or organization: Not on file    Attends meetings of clubs or organizations: Not on file    Relationship status: Not on file  . Intimate partner violence:    Fear of current or ex partner:  Not on file    Emotionally abused: Not on file    Physically abused: Not on file    Forced sexual activity: Not on file  Other Topics Concern  . Not on file  Social History Narrative   Single, no children.   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   Lives in [place], [place].   Smoked cigarettes for a few years on and off, quit for good 07/2010.   No alcohol or drug use.    Outpatient Encounter Medications as of 12/23/2018  Medication Sig  . METHADONE HCL PO Take by mouth.  . ondansetron (ZOFRAN) 4 MG tablet Take 1 tablet (4 mg total) by mouth every 8 (eight) hours as needed for nausea or vomiting.   No facility-administered encounter medications on file as of 12/23/2018.     No Known Allergies  Review of Systems  Constitutional: Negative for activity change, appetite change, chills, diaphoresis, fatigue, fever, unexpected weight change and weight loss.  Respiratory: Negative for cough and shortness of breath.   Cardiovascular: Negative for chest pain, palpitations and leg swelling.  Gastrointestinal: Positive for diarrhea and nausea. Negative for abdominal distention, abdominal pain, anal bleeding, bloating, blood in stool, constipation, flatus, rectal pain and vomiting.  Genitourinary: Negative for decreased urine volume.  Musculoskeletal: Negative for arthralgias and myalgias.  Neurological: Negative for weakness and headaches.  Psychiatric/Behavioral: Negative for confusion.  All other systems reviewed and are negative.        Observations/Objective: No vital signs or physical exam, this was a telephone or virtual health encounter.  Pt alert and oriented, answers all questions appropriately, and able to speak in full sentences.    Assessment and Plan: Diagnoses and all orders for this visit:  Diarrhea in adult patient Pt is detoxing from methadone. States he has been slowly weaning down. he is followed by the pain clinic. States he has  developed diarrhea with the detoxing. Symptomatic care discussed. Can increase fiber intake and stay hydrated. Can try over the counter imodium as needed.   Nausea in adult BRAT and bland diet discussed. Stay hydrated. Medications as prescribed. Report any new or worsening symptoms.  -     ondansetron (ZOFRAN) 4 MG tablet; Take 1 tablet (4 mg total) by mouth every 8 (eight) hours as needed for nausea or vomiting.     Follow Up Instructions: Return if symptoms worsen or fail to improve.    I discussed the assessment and treatment plan with the patient. The patient was provided an opportunity to ask questions and all were answered. The patient agreed with the plan and demonstrated an understanding of the instructions.   The patient was advised to call back or seek an in-person evaluation if the symptoms worsen or if the  condition fails to improve as anticipated.  The above assessment and management plan was discussed with the patient. The patient verbalized understanding of and has agreed to the management plan. Patient is aware to call the clinic if symptoms persist or worsen. Patient is aware when to return to the clinic for a follow-up visit. Patient educated on when it is appropriate to go to the emergency department.    I provided 15 minutes of non-face-to-face time during this encounter. The call started at 1415. The call ended at 1430. The other time was used for coordination of care.    Monia Pouch, FNP-C [facility] [address] [place], [place] [address] [phone]

## 2019-02-12 ENCOUNTER — Ambulatory Visit: Payer: BLUE CROSS/BLUE SHIELD | Admitting: Family Medicine

## 2019-02-24 ENCOUNTER — Ambulatory Visit: Payer: BC Managed Care – PPO | Admitting: Family Medicine

## 2019-02-24 ENCOUNTER — Other Ambulatory Visit: Payer: Self-pay

## 2019-02-24 ENCOUNTER — Encounter: Payer: Self-pay | Admitting: Family Medicine

## 2019-02-24 DIAGNOSIS — M5412 Radiculopathy, cervical region: Secondary | ICD-10-CM | POA: Diagnosis not present

## 2019-02-24 MED ORDER — NAPROXEN 500 MG PO TABS: 500.0000 mg | ORAL_TABLET | Freq: Two times a day (BID) | ORAL | 1 refills | Status: DC

## 2019-02-24 MED ORDER — PREDNISONE 20 MG PO TABS: ORAL_TABLET | ORAL | 0 refills | Status: DC

## 2019-02-24 NOTE — Patient Instructions (Signed)
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
## 2019-02-28 ENCOUNTER — Encounter (HOSPITAL_COMMUNITY): Payer: Self-pay

## 2019-02-28 ENCOUNTER — Other Ambulatory Visit: Payer: Self-pay

## 2019-02-28 ENCOUNTER — Ambulatory Visit (HOSPITAL_COMMUNITY)
Admission: RE | Admit: 2019-02-28 | Discharge: 2019-02-28 | Disposition: A | Discharge status: Home / Self Care | Payer: BC Managed Care – PPO | Source: Ambulatory Visit | Attending: Family Medicine | Admitting: Family Medicine

## 2019-02-28 DIAGNOSIS — M5412 Radiculopathy, cervical region: Secondary | ICD-10-CM | POA: Diagnosis not present

## 2019-03-06 ENCOUNTER — Other Ambulatory Visit: Payer: Self-pay

## 2019-03-06 ENCOUNTER — Encounter: Payer: Self-pay | Admitting: Family

## 2019-03-06 ENCOUNTER — Ambulatory Visit (INDEPENDENT_AMBULATORY_CARE_PROVIDER_SITE_OTHER): Payer: BC Managed Care – PPO | Admitting: Family

## 2019-03-06 DIAGNOSIS — R300 Dysuria: Secondary | ICD-10-CM | POA: Diagnosis not present

## 2019-03-06 DIAGNOSIS — R319 Hematuria, unspecified: Secondary | ICD-10-CM | POA: Diagnosis not present

## 2019-03-06 LAB — URINALYSIS, COMPLETE
Bilirubin, UA: NEGATIVE
Glucose, UA: NEGATIVE
Ketones, UA: NEGATIVE
Leukocytes,UA: NEGATIVE
Nitrite, UA: NEGATIVE
Specific Gravity, UA: 1.015 (ref 1.005–1.030)
Urobilinogen, Ur: 0.2 mg/dL (ref 0.2–1.0)
pH, UA: 7 (ref 5.0–7.5)

## 2019-03-06 LAB — CBC WITH DIFFERENTIAL/PLATELET
Basophils Absolute: 0.1 10*3/uL (ref 0.0–0.2)
Basos: 0 %
EOS (ABSOLUTE): 0.2 10*3/uL (ref 0.0–0.4)
Eos: 2 %
Hematocrit: 45.5 % (ref 37.5–51.0)
Hemoglobin: 15.4 g/dL (ref 13.0–17.7)
Immature Grans (Abs): 0 10*3/uL (ref 0.0–0.1)
Immature Granulocytes: 0 %
Lymphocytes Absolute: 3.2 10*3/uL — ABNORMAL HIGH (ref 0.7–3.1)
Lymphs: 27 %
MCH: 28.8 pg (ref 26.6–33.0)
MCHC: 33.8 g/dL (ref 31.5–35.7)
MCV: 85 fL (ref 79–97)
Monocytes Absolute: 1 10*3/uL — ABNORMAL HIGH (ref 0.1–0.9)
Monocytes: 9 %
Neutrophils Absolute: 7.2 10*3/uL — ABNORMAL HIGH (ref 1.4–7.0)
Neutrophils: 62 %
Platelets: 312 10*3/uL (ref 150–450)
RBC: 5.35 x10E6/uL (ref 4.14–5.80)
RDW: 12.1 % (ref 11.6–15.4)
WBC: 11.7 10*3/uL — ABNORMAL HIGH (ref 3.4–10.8)

## 2019-03-06 LAB — MICROSCOPIC EXAMINATION
Bacteria, UA: NONE SEEN
RBC, Urine: >30 /hpf — AB (ref 0–2)
Renal Epithel, UA: NONE SEEN /hpf

## 2019-03-06 NOTE — Progress Notes (Signed)
   Virtual Visit via telephone Note Due to COVID-19 pandemic this visit was conducted virtually. This visit type was conducted due to national recommendations for restrictions regarding the COVID-19 Pandemic (e.g. social distancing, sheltering in place) in an effort to limit this patient's exposure and mitigate transmission in our community. All issues noted in this document were discussed and addressed.  A physical exam was not performed with this format.  I connected with Lawrence Mendoza on 03/06/19 at 9:03 AM by telephone and verified that I am speaking with the correct person using two identifiers. Lawrence Mendoza is currently located at home and sister is currently with her during visit. The provider, Evelina Dun, FNP is located in their office at time of visit.  I discussed the limitations, risks, security and privacy concerns of performing an evaluation and management service by telephone and the availability of in person appointments. I also discussed with the patient that there may be a patient responsible charge related to this service. The patient expressed understanding and agreed to proceed.   History and Present Illness:  Hematuria This is a new problem. The current episode started today. The problem is unchanged. He describes the hematuria as gross hematuria. His pain is at a severity of 0/10. He is experiencing no pain. Irritative symptoms include frequency and urgency. Irritative symptoms do not include nocturia. Obstructive symptoms include straining. Associated symptoms include dysuria, hesitancy and nausea. Pertinent negatives include no fever, flank pain, genital pain or vomiting.      Review of Systems  Constitutional: Negative for fever.  Gastrointestinal: Positive for nausea. Negative for vomiting.  Genitourinary: Positive for dysuria, frequency, hematuria, hesitancy and urgency. Negative for flank pain and nocturia.  All other systems reviewed and are negative.    Observations/Objective: No SOB or distress noted   Assessment and Plan: 1. Hematuria, unspecified type - Urinalysis, Complete - Urine Culture - Chlamydia/Gonococcus/Trichomonas, NAA - CBC with Differential/Platelet  2. Dysuria - Urinalysis, Complete - Urine Culture - Chlamydia/Gonococcus/Trichomonas, NAA - CBC with Differential/Platelet   Force fluids Pt will come to office and have urine and lab work done today If UTI will send in antibiotics  RTO if symptoms worsen or do not improve or worsen    I discussed the assessment and treatment plan with the patient. The patient was provided an opportunity to ask questions and all were answered. The patient agreed with the plan and demonstrated an understanding of the instructions.   The patient was advised to call back or seek an in-person evaluation if the symptoms worsen or if the condition fails to improve as anticipated.  The above assessment and management plan was discussed with the patient. The patient verbalized understanding of and has agreed to the management plan. Patient is aware to call the clinic if symptoms persist or worsen. Patient is aware when to return to the clinic for a follow-up visit. Patient educated on when it is appropriate to go to the emergency department.   Time call ended:  9:16 AM   I provided 13 minutes of non-face-to-face time during this encounter.    Evelina Dun, FNP

## 2019-03-07 LAB — URINE CULTURE: Organism ID, Bacteria: NO GROWTH

## 2019-03-10 ENCOUNTER — Other Ambulatory Visit: Payer: BC Managed Care – PPO

## 2019-03-10 ENCOUNTER — Other Ambulatory Visit: Payer: Self-pay

## 2019-03-10 ENCOUNTER — Other Ambulatory Visit: Payer: Self-pay | Admitting: Family

## 2019-03-10 DIAGNOSIS — N200 Calculus of kidney: Secondary | ICD-10-CM | POA: Diagnosis not present

## 2019-03-10 DIAGNOSIS — R3989 Other symptoms and signs involving the genitourinary system: Secondary | ICD-10-CM

## 2019-03-10 DIAGNOSIS — R319 Hematuria, unspecified: Secondary | ICD-10-CM | POA: Diagnosis not present

## 2019-03-10 DIAGNOSIS — Z6825 Body mass index (BMI) 25.0-25.9, adult: Secondary | ICD-10-CM | POA: Diagnosis not present

## 2019-03-10 LAB — URINALYSIS, COMPLETE
Bilirubin, UA: NEGATIVE
Glucose, UA: NEGATIVE
Ketones, UA: NEGATIVE
Leukocytes,UA: NEGATIVE
Nitrite, UA: NEGATIVE
Protein,UA: NEGATIVE
Specific Gravity, UA: 1.025 (ref 1.005–1.030)
Urobilinogen, Ur: 0.2 mg/dL (ref 0.2–1.0)
pH, UA: 6 (ref 5.0–7.5)

## 2019-03-10 LAB — MICROSCOPIC EXAMINATION
Bacteria, UA: NONE SEEN
Epithelial Cells (non renal): NONE SEEN /hpf (ref 0–10)
RBC, Urine: >30 /hpf — AB (ref 0–2)
Renal Epithel, UA: NONE SEEN /hpf
WBC, UA: NONE SEEN /hpf (ref 0–5)

## 2019-03-11 LAB — CHLAMYDIA/GONOCOCCUS/TRICHOMONAS, NAA
Chlamydia by NAA: NEGATIVE
Gonococcus by NAA: NEGATIVE
Trich vag by NAA: NEGATIVE

## 2019-03-13 DIAGNOSIS — Z6826 Body mass index (BMI) 26.0-26.9, adult: Secondary | ICD-10-CM | POA: Diagnosis not present

## 2019-03-13 DIAGNOSIS — R350 Frequency of micturition: Secondary | ICD-10-CM | POA: Diagnosis not present

## 2019-03-13 DIAGNOSIS — R110 Nausea: Secondary | ICD-10-CM | POA: Diagnosis not present

## 2019-03-13 DIAGNOSIS — R3911 Hesitancy of micturition: Secondary | ICD-10-CM | POA: Diagnosis not present

## 2019-03-13 DIAGNOSIS — R310 Gross hematuria: Secondary | ICD-10-CM | POA: Diagnosis not present

## 2019-03-21 DIAGNOSIS — R310 Gross hematuria: Secondary | ICD-10-CM | POA: Diagnosis not present

## 2019-03-21 DIAGNOSIS — N200 Calculus of kidney: Secondary | ICD-10-CM | POA: Diagnosis not present

## 2019-03-27 ENCOUNTER — Other Ambulatory Visit: Payer: Self-pay

## 2019-03-28 ENCOUNTER — Ambulatory Visit: Payer: BC Managed Care – PPO | Admitting: Family Medicine

## 2019-09-01 ENCOUNTER — Other Ambulatory Visit: Payer: Self-pay

## 2019-09-02 ENCOUNTER — Ambulatory Visit: Payer: BC Managed Care – PPO | Admitting: Family

## 2019-09-02 ENCOUNTER — Ambulatory Visit: Payer: Self-pay | Admitting: Nurse Practitioner

## 2019-09-03 ENCOUNTER — Encounter: Payer: Self-pay | Admitting: Family

## 2019-09-03 ENCOUNTER — Telehealth (INDEPENDENT_AMBULATORY_CARE_PROVIDER_SITE_OTHER): Payer: Self-pay | Admitting: Family

## 2019-09-03 DIAGNOSIS — F1911 Other psychoactive substance abuse, in remission: Secondary | ICD-10-CM
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
## 2019-12-11 ENCOUNTER — Other Ambulatory Visit: Payer: Self-pay

## 2019-12-11 ENCOUNTER — Ambulatory Visit (INDEPENDENT_AMBULATORY_CARE_PROVIDER_SITE_OTHER): Payer: BC Managed Care – PPO | Admitting: Physician Assistant

## 2019-12-11 ENCOUNTER — Encounter: Payer: Self-pay | Admitting: Physician Assistant

## 2019-12-11 VITALS — BP 131/86 | HR 96 | Temp 98.1°F | Resp 20 | Ht 67.5 in | Wt 166.0 lb

## 2019-12-11 DIAGNOSIS — D229 Melanocytic nevi, unspecified: Secondary | ICD-10-CM | POA: Diagnosis not present

## 2019-12-11 DIAGNOSIS — F1911 Other psychoactive substance abuse, in remission: Secondary | ICD-10-CM | POA: Diagnosis not present

## 2019-12-11 DIAGNOSIS — R319 Hematuria, unspecified: Secondary | ICD-10-CM | POA: Diagnosis not present

## 2019-12-11 DIAGNOSIS — Z7251 High risk heterosexual behavior: Secondary | ICD-10-CM

## 2019-12-11 NOTE — Patient Instructions (Signed)
Kidney Stones  Kidney stones are solid, rock-like deposits that form inside of the kidneys. The kidneys are a pair of organs that make urine. A kidney stone may form in a kidney and move into other parts of the urinary tract, including the tubes that connect the kidneys to the bladder (ureters), the bladder, and the tube that carries urine out of the body (urethra). As the stone moves through these areas, it can cause intense pain and block the flow of urine. Kidney stones are created when high levels of certain minerals are found in the urine. The stones are usually passed out of the body through urination, but in some cases, medical treatment may be needed to remove them. What are the causes? Kidney stones may be caused by:  A condition in which certain glands produce too much parathyroid hormone (primary hyperparathyroidism), which causes too much calcium buildup in the blood.  A buildup of uric acid crystals in the bladder (hyperuricosuria). Uric acid is a chemical that the body produces when you eat certain foods. It usually exits the body in the urine.  Narrowing (stricture) of one or both of the ureters.  A kidney blockage that is present at birth (congenital obstruction).  Past surgery on the kidney or the ureters, such as gastric bypass surgery. What increases the risk? The following factors may make you more likely to develop this condition:  Having had a kidney stone in the past.  Having a family history of kidney stones.  Not drinking enough water.  Eating a diet that is high in protein, salt (sodium), or sugar.  Being overweight or obese. What are the signs or symptoms? Symptoms of a kidney stone may include:  Pain in the side of the abdomen, right below the ribs (flank pain). Pain usually spreads (radiates) to the groin.  Needing to urinate frequently or urgently.  Painful urination.  Blood in the urine (hematuria).  Nausea.  Vomiting.  Fever and chills. How  is this diagnosed? This condition may be diagnosed based on:  Your symptoms and medical history.  A physical exam.  Blood tests.  Urine tests. These may be done before and after the stone passes out of your body through urination.  Imaging tests, such as a CT scan, abdominal X-ray, or ultrasound.  A procedure to examine the inside of the bladder (cystoscopy). How is this treated? Treatment for kidney stones depends on the size, location, and makeup of the stones. Kidney stones will often pass out of the body through urination. You may need to:  Increase your fluid intake to help pass the stone. In some cases, you may be given fluids through an IV and may need to be monitored at the hospital.  Take medicine for pain.  Make changes in your diet to help prevent kidney stones from coming back. Sometimes, medical procedures are needed to remove a kidney stone. This may involve:  A procedure to break up kidney stones using: ? A focused beam of light (laser therapy). ? Shock waves (extracorporeal shock wave lithotripsy).  Surgery to remove kidney stones. This may be needed if you have severe pain or have stones that block your urinary tract. Follow these instructions at home: Medicines  Take over-the-counter and prescription medicines only as told by your health care provider.  Ask your health care provider if the medicine prescribed to you requires you to avoid driving or using heavy machinery. Eating and drinking  Drink enough fluid to keep your urine pale yellow.   You may be instructed to drink at least 8-10 glasses of water each day. This will help you pass the kidney stone.  If directed, change your diet. This may include: ? Limiting how much sodium you eat. ? Eating more fruits and vegetables. ? Limiting how much animal protein--such as red meat, poultry, fish, and eggs--you eat.  Follow instructions from your health care provider about eating or drinking  restrictions. General instructions  Collect urine samples as told by your health care provider. You may need to collect a urine sample: ? 24 hours after you pass the stone. ? 8-12 weeks after passing the kidney stone, and every 6-12 months after that.  Strain your urine every time you urinate, for as long as directed. Use the strainer that your health care provider recommends.  Do not throw out the kidney stone after passing it. Keep the stone so it can be tested by your health care provider. Testing the makeup of your kidney stone may help prevent you from getting kidney stones in the future.  Keep all follow-up visits as told by your health care provider. This is important. You may need follow-up X-rays or ultrasounds to make sure that your stone has passed. How is this prevented? To prevent another kidney stone:  Drink enough fluid to keep your urine pale yellow. This is the best way to prevent kidney stones.  Eat a healthy diet and follow recommendations from your health care provider about foods to avoid. You may be instructed to eat a low-protein diet. Recommendations vary depending on the type of kidney stone that you have.  Maintain a healthy weight. Where to find more information  National Kidney Foundation (NKF): www.kidney.org  [employer] (UCF): www.urologyhealth.org Contact a health care provider if:  You have pain that gets worse or does not get better with medicine. Get help right away if:  You have a fever or chills.  You develop severe pain.  You develop new abdominal pain.  You faint.  You are unable to urinate. Summary  Kidney stones are solid, rock-like deposits that form inside of the kidneys.  Kidney stones can cause nausea, vomiting, blood in the urine, abdominal pain, and the urge to urinate frequently.  Treatment for kidney stones depends on the size, location, and makeup of the stones. Kidney stones will often pass out of the body  through urination.  Kidney stones can be prevented by drinking enough fluids, eating a healthy diet, and maintaining a healthy weight. This information is not intended to replace advice given to you by your health care provider. Make sure you discuss any questions you have with your health care provider. Document Revised: 11/26/2018 Document Reviewed: 11/26/2018 Elsevier Patient Education  2020 Elsevier Inc.  

## 2019-12-11 NOTE — Progress Notes (Signed)
  Subjective:     Patient ID: PHI OLCZAK, male   DOB: 1991/04/05, 29 y.o.   MRN: [id number]  HPI Pt here for several reasons #1- Pt with past hx of high risk sexual behavior and drug use Prev hx of Hep C that has been 'taken care of" Pt would like full STD testing #2- pt with hx of renal stone and referral to [facility] in [facility] Pt was unable to follow up due to loss of job and sig bills Pt still with intermit back pain and hematuria and would like f/u with them Pt has regained employment and insurance at this time #3- Pt with several moles that he has noticed change in color  Review of Systems  Constitutional: Negative.   Genitourinary: Positive for flank pain and hematuria. Negative for difficulty urinating, discharge, dysuria, frequency, penile pain and penile swelling.  Skin:       Nevus with change       Objective:   Physical Exam Vitals and nursing note reviewed.  Constitutional:      Appearance: Normal appearance. He is normal weight.  Cardiovascular:     Rate and Rhythm: Normal rate and regular rhythm.     Pulses: Normal pulses.  Pulmonary:     Effort: Pulmonary effort is normal.     Breath sounds: Normal breath sounds.  Abdominal:     General: Abdomen is flat. There is no distension.     Palpations: Abdomen is soft. There is no mass.     Tenderness: There is no abdominal tenderness. There is no right CVA tenderness, left CVA tenderness, guarding or rebound.     Hernia: No hernia is present.  Skin:    Comments: Multiple nevus  Very large nevus to the R lateral L-spine area  Neurological:     Mental Status: He is alert.        Assessment:     1. Hematuria, unspecified type   2. H/O: substance abuse ([place])   3. Irritated nevus   4. High risk sexual behavior, unspecified type        Plan:     Full STD panel done today per pt request Since he has almost paid off his balance and now has insurance encouraged him to call [facility] for appt Informed should  not need re referral Stressed importance of hydration Referral to derm for further eval F/U here prn

## 2019-12-12 ENCOUNTER — Telehealth: Payer: Self-pay | Admitting: Family

## 2019-12-12 DIAGNOSIS — R768 Other specified abnormal immunological findings in serum: Secondary | ICD-10-CM

## 2019-12-12 LAB — CMP14+EGFR
ALT: 14 IU/L (ref 0–44)
AST: 13 IU/L (ref 0–40)
Albumin/Globulin Ratio: 2.2 (ref 1.2–2.2)
Albumin: 4.8 g/dL (ref 4.1–5.2)
Alkaline Phosphatase: 87 IU/L (ref 48–121)
BUN/Creatinine Ratio: 20 (ref 9–20)
BUN: 21 mg/dL — ABNORMAL HIGH (ref 6–20)
Bilirubin Total: 0.5 mg/dL (ref 0.0–1.2)
CO2: 24 mmol/L (ref 20–29)
Calcium: 9.8 mg/dL (ref 8.7–10.2)
Chloride: 103 mmol/L (ref 96–106)
Creatinine, Ser: 1.03 mg/dL (ref 0.76–1.27)
GFR calc Af Amer: 114 mL/min/{1.73_m2} (ref >59)
GFR calc non Af Amer: 98 mL/min/{1.73_m2} (ref >59)
Globulin, Total: 2.2 g/dL (ref 1.5–4.5)
Glucose: 81 mg/dL (ref 65–99)
Potassium: 4.1 mmol/L (ref 3.5–5.2)
Sodium: 141 mmol/L (ref 134–144)
Total Protein: 7 g/dL (ref 6.0–8.5)

## 2019-12-12 LAB — STD SCREEN (8)
HIV Screen 4th Generation wRfx: NONREACTIVE
HSV 1 Glycoprotein G Ab, IgG: <0.91 index (ref 0.00–0.90)
HSV 2 IgG, Type Spec: <0.91 index (ref 0.00–0.90)
Hep A IgM: NEGATIVE
Hep B C IgM: NEGATIVE
Hep C Virus Ab: 2.7 s/co ratio — ABNORMAL HIGH (ref 0.0–0.9)
Hepatitis B Surface Ag: NEGATIVE
RPR Ser Ql: NONREACTIVE

## 2019-12-12 LAB — HIV ANTIBODY (ROUTINE TESTING W REFLEX): HIV Screen 4th Generation wRfx: NONREACTIVE

## 2019-12-12 NOTE — Telephone Encounter (Signed)
Please let patient know that his hepatitis C antibody has come back positive, this does not say whether it is infection or past infection or possible exposure, he needs to come back and do a follow-up blood work to see if he has an infection with hepatitis C or not. The rest of his blood work and STD panel came back normal. Patient notified and will come in for follow-up blood work. Caryl Pina, MD [facility] 12/12/2019, 1:17 PM

## 2019-12-12 NOTE — Telephone Encounter (Signed)
Tonita Cong was provider for patient.  No back up assigned.  Please review lab results and advise.

## 2019-12-15 LAB — CHLAMYDIA/GONOCOCCUS/TRICHOMONAS, NAA
Chlamydia by NAA: NEGATIVE
Gonococcus by NAA: NEGATIVE
Trich vag by NAA: NEGATIVE

## 2019-12-17 ENCOUNTER — Other Ambulatory Visit: Payer: BC Managed Care – PPO

## 2019-12-17 ENCOUNTER — Other Ambulatory Visit: Payer: Self-pay

## 2019-12-17 DIAGNOSIS — R768 Other specified abnormal immunological findings in serum: Secondary | ICD-10-CM

## 2019-12-17 DIAGNOSIS — Z7289 Other problems related to lifestyle: Secondary | ICD-10-CM | POA: Diagnosis not present

## 2019-12-18 DIAGNOSIS — R310 Gross hematuria: Secondary | ICD-10-CM | POA: Diagnosis not present

## 2019-12-18 DIAGNOSIS — R350 Frequency of micturition: Secondary | ICD-10-CM | POA: Diagnosis not present

## 2019-12-18 LAB — HCV RNA QUANT: Hepatitis C Quantitation: NOT DETECTED IU/mL

## 2020-01-20 DIAGNOSIS — R350 Frequency of micturition: Secondary | ICD-10-CM | POA: Diagnosis not present

## 2020-01-20 DIAGNOSIS — R310 Gross hematuria: Secondary | ICD-10-CM | POA: Diagnosis not present

## 2020-01-21 DIAGNOSIS — R310 Gross hematuria: Secondary | ICD-10-CM | POA: Diagnosis not present

## 2020-01-30 ENCOUNTER — Encounter: Payer: BC Managed Care – PPO | Admitting: Family Medicine

## 2020-01-30 ENCOUNTER — Encounter: Payer: Self-pay | Admitting: Family Medicine

## 2020-01-30 DIAGNOSIS — J351 Hypertrophy of tonsils: Secondary | ICD-10-CM | POA: Diagnosis not present

## 2020-01-30 DIAGNOSIS — Z6826 Body mass index (BMI) 26.0-26.9, adult: Secondary | ICD-10-CM | POA: Diagnosis not present

## 2020-01-30 DIAGNOSIS — J020 Streptococcal pharyngitis: Secondary | ICD-10-CM | POA: Diagnosis not present

## 2020-01-30 NOTE — Progress Notes (Signed)
Pt. Did not respond to link.  Claretta Fraise, MD

## 2020-02-12 DIAGNOSIS — R310 Gross hematuria: Secondary | ICD-10-CM | POA: Diagnosis not present

## 2020-02-12 DIAGNOSIS — N200 Calculus of kidney: Secondary | ICD-10-CM | POA: Diagnosis not present

## 2020-02-12 DIAGNOSIS — R350 Frequency of micturition: Secondary | ICD-10-CM | POA: Diagnosis not present

## 2020-03-05 ENCOUNTER — Other Ambulatory Visit: Payer: Self-pay | Admitting: Urology

## 2020-03-12 ENCOUNTER — Encounter (HOSPITAL_BASED_OUTPATIENT_CLINIC_OR_DEPARTMENT_OTHER): Payer: Self-pay | Admitting: Urology

## 2020-03-12 ENCOUNTER — Other Ambulatory Visit (HOSPITAL_COMMUNITY)
Admission: RE | Admit: 2020-03-12 | Discharge: 2020-03-12 | Disposition: A | Discharge status: Home / Self Care | Payer: BC Managed Care – PPO | Source: Ambulatory Visit | Attending: Urology | Admitting: Urology

## 2020-03-12 DIAGNOSIS — Z01812 Encounter for preprocedural laboratory examination: Secondary | ICD-10-CM | POA: Diagnosis not present

## 2020-03-12 DIAGNOSIS — Z20822 Contact with and (suspected) exposure to covid-19: Secondary | ICD-10-CM | POA: Insufficient documentation

## 2020-03-12 LAB — SARS CORONAVIRUS 2 (TAT 6-24 HRS): SARS Coronavirus 2: NEGATIVE

## 2020-03-12 NOTE — Progress Notes (Signed)
Tired to call patient several times. Left message on first call to call back. 2 hours later no word. Tried again to call, phone does not ring. Called office to inform them unable to get in touch with pt.
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
## 2020-03-15 ENCOUNTER — Other Ambulatory Visit: Payer: Self-pay

## 2020-03-15 ENCOUNTER — Encounter (HOSPITAL_BASED_OUTPATIENT_CLINIC_OR_DEPARTMENT_OTHER): Payer: Self-pay | Admitting: Urology

## 2020-03-15 ENCOUNTER — Ambulatory Visit (HOSPITAL_COMMUNITY): Payer: BC Managed Care – PPO

## 2020-03-15 ENCOUNTER — Encounter (HOSPITAL_BASED_OUTPATIENT_CLINIC_OR_DEPARTMENT_OTHER)
Admission: RE | Disposition: A | Discharge status: Home / Self Care | Payer: Self-pay | Source: Home / Self Care | Attending: Urology

## 2020-03-15 ENCOUNTER — Ambulatory Visit (HOSPITAL_BASED_OUTPATIENT_CLINIC_OR_DEPARTMENT_OTHER)
Admission: RE | Admit: 2020-03-15 | Discharge: 2020-03-15 | Disposition: A | Discharge status: Home / Self Care | Payer: BC Managed Care – PPO | Attending: Urology | Admitting: Urology

## 2020-03-15 DIAGNOSIS — F17200 Nicotine dependence, unspecified, uncomplicated: Secondary | ICD-10-CM | POA: Diagnosis not present

## 2020-03-15 DIAGNOSIS — N200 Calculus of kidney: Secondary | ICD-10-CM | POA: Diagnosis not present

## 2020-03-15 HISTORY — PX: EXTRACORPOREAL SHOCK WAVE LITHOTRIPSY: SHX1557

## 2020-03-15 SURGERY — LITHOTRIPSY, ESWL
Anesthesia: LOCAL | Laterality: Left

## 2020-03-15 MED ORDER — HYDROCODONE-ACETAMINOPHEN 5-325 MG PO TABS
1.0000 | ORAL_TABLET | ORAL | 0 refills | Status: DC | PRN
Start: 2020-03-15 — End: 2020-09-03

## 2020-03-15 MED ORDER — DIPHENHYDRAMINE HCL 25 MG PO CAPS
ORAL_CAPSULE | ORAL | Status: AC
  Filled 2020-03-15: qty 1

## 2020-03-15 MED ORDER — DIAZEPAM 5 MG PO TABS
ORAL_TABLET | ORAL | Status: AC
  Filled 2020-03-15: qty 2

## 2020-03-15 MED ORDER — CIPROFLOXACIN HCL 500 MG PO TABS
ORAL_TABLET | ORAL | Status: AC
  Filled 2020-03-15: qty 1

## 2020-03-15 MED ORDER — DIPHENHYDRAMINE HCL 25 MG PO CAPS
25.0000 mg | ORAL_CAPSULE | ORAL | Status: AC
  Administered 2020-03-15: 25 mg via ORAL

## 2020-03-15 MED ORDER — CIPROFLOXACIN HCL 500 MG PO TABS
500.0000 mg | ORAL_TABLET | ORAL | Status: AC
  Administered 2020-03-15: 500 mg via ORAL

## 2020-03-15 MED ORDER — DIAZEPAM 5 MG PO TABS
10.0000 mg | ORAL_TABLET | ORAL | Status: AC
  Administered 2020-03-15: 10 mg via ORAL

## 2020-03-15 MED ORDER — SODIUM CHLORIDE 0.9 % IV SOLN: INTRAVENOUS | Status: DC

## 2020-03-15 NOTE — Op Note (Signed)
See [facility] OP note scanned into chart. 

## 2020-03-15 NOTE — Discharge Instructions (Signed)
Lithotripsy, Care After This sheet gives you information about how to care for yourself after your procedure. Your health care provider may also give you more specific instructions. If you have problems or questions, contact your health care provider. What can I expect after the procedure? After the procedure, it is common to have:  Some blood in your urine. This should only last for a few days.  Soreness in your back, sides, or upper abdomen for a few days.  Blotches or bruises on your back where the pressure wave entered the skin.  Pain, discomfort, or nausea when pieces (fragments) of the kidney stone move through the tube that carries urine from the kidney to the bladder (ureter). Stone fragments may pass soon after the procedure, but they may continue to pass for up to 4-8 weeks. ? If you have severe pain or nausea, contact your health care provider. This may be caused by a large stone that was not broken up, and this may mean that you need more treatment.  Some pain or discomfort during urination.  Some pain or discomfort in the lower abdomen or (in men) at the base of the penis. Follow these instructions at home: Medicines  Take over-the-counter and prescription medicines only as told by your health care provider.  If you were prescribed an antibiotic medicine, take it as told by your health care provider. Do not stop taking the antibiotic even if you start to feel better.  Do not drive for 24 hours if you were given a medicine to help you relax (sedative).  Do not drive or use heavy machinery while taking prescription pain medicine. Eating and drinking      Drink enough water and fluids to keep your urine clear or pale yellow. This helps any remaining pieces of the stone to pass. It can also help prevent new stones from forming.  Eat plenty of fresh fruits and vegetables.  Follow instructions from your health care provider about eating and drinking restrictions. You may be  instructed: ? To reduce how much salt (sodium) you eat or drink. Check ingredients and nutrition facts on packaged foods and beverages. ? To reduce how much meat you eat.  Eat the recommended amount of calcium for your age and gender. Ask your health care provider how much calcium you should have. General instructions  Get plenty of rest.  Most people can resume normal activities 1-2 days after the procedure. Ask your health care provider what activities are safe for you.  Your health care provider may direct you to lie in a certain position (postural drainage) and tap firmly (percuss) over your kidney area to help stone fragments pass. Follow instructions as told by your health care provider.  If directed, strain all urine through the strainer that was provided by your health care provider. ? Keep all fragments for your health care provider to see. Any stones that are found may be sent to a medical lab for examination. The stone may be as small as a grain of salt.  Keep all follow-up visits as told by your health care provider. This is important. Contact a health care provider if:  You have pain that is severe or does not get better with medicine.  You have nausea that is severe or does not go away.  You have blood in your urine longer than your health care provider told you to expect.  You have more blood in your urine.  You have pain during urination that does   not go away.  You urinate more frequently than usual and this does not go away.  You develop a rash or any other possible signs of an allergic reaction. Get help right away if:  You have severe pain in your back, sides, or upper abdomen.  You have severe pain while urinating.  Your urine is very dark red.  You have blood in your stool (feces).  You cannot pass any urine at all.  You feel a strong urge to urinate after emptying your bladder.  You have a fever or chills.  You develop shortness of breath,  difficulty breathing, or chest pain.  You have severe nausea that leads to persistent vomiting.  You faint. Summary  After this procedure, it is common to have some pain, discomfort, or nausea when pieces (fragments) of the kidney stone move through the tube that carries urine from the kidney to the bladder (ureter). If this pain or nausea is severe, however, you should contact your health care provider.  Most people can resume normal activities 1-2 days after the procedure. Ask your health care provider what activities are safe for you.  Drink enough water and fluids to keep your urine clear or pale yellow. This helps any remaining pieces of the stone to pass, and it can help prevent new stones from forming.  If directed, strain your urine and keep all fragments for your health care provider to see. Fragments or stones may be as small as a grain of salt.  Get help right away if you have severe pain in your back, sides, or upper abdomen or have severe pain while urinating. This information is not intended to replace advice given to you by your health care provider. Make sure you discuss any questions you have with your health care provider. Document Revised: 10/21/2018 Document Reviewed: 05/31/2016 Elsevier Patient Education  [place] Instructions  Activity: Get plenty of rest for the remainder of the day. A responsible individual must stay with you for 24 hours following the procedure.  For the next 24 hours, DO NOT: -Drive a car -Paediatric nurse -Drink alcoholic beverages -Take any medication unless instructed by your physician -Make any legal decisions or sign important papers.  Meals: Start with liquid foods such as gelatin or soup. Progress to regular foods as tolerated. Avoid greasy, spicy, heavy foods. If nausea and/or vomiting occur, drink only clear liquids until the nausea and/or vomiting subsides. Call your physician if vomiting  continues.

## 2020-03-15 NOTE — Interval H&P Note (Signed)
History and Physical Interval Note:  03/15/2020 8:49 AM  Lawrence Mendoza  has presented today for surgery, with the diagnosis of LEFT RENAL CALCULUS.  The various methods of treatment have been discussed with the patient and family. After consideration of risks, benefits and other options for treatment, the patient has consented to  Procedure(s): EXTRACORPOREAL SHOCK WAVE LITHOTRIPSY (ESWL) (Left) as a surgical intervention.  The patient's history has been reviewed, patient examined, no change in status, stable for surgery.  I have reviewed the patient's chart and labs.  Questions were answered to the patient's satisfaction.     [name]

## 2020-03-16 ENCOUNTER — Encounter (HOSPITAL_BASED_OUTPATIENT_CLINIC_OR_DEPARTMENT_OTHER): Payer: Self-pay | Admitting: Urology

## 2020-03-17 DIAGNOSIS — R8271 Bacteriuria: Secondary | ICD-10-CM | POA: Diagnosis not present

## 2020-03-17 DIAGNOSIS — N200 Calculus of kidney: Secondary | ICD-10-CM | POA: Diagnosis not present

## 2020-03-17 DIAGNOSIS — R310 Gross hematuria: Secondary | ICD-10-CM | POA: Diagnosis not present

## 2020-03-18 ENCOUNTER — Ambulatory Visit (INDEPENDENT_AMBULATORY_CARE_PROVIDER_SITE_OTHER): Payer: BC Managed Care – PPO | Admitting: Family

## 2020-03-18 ENCOUNTER — Encounter: Payer: Self-pay | Admitting: Family

## 2020-03-18 ENCOUNTER — Other Ambulatory Visit: Payer: Self-pay

## 2020-03-18 ENCOUNTER — Other Ambulatory Visit (HOSPITAL_COMMUNITY)
Admission: RE | Admit: 2020-03-18 | Discharge: 2020-03-18 | Disposition: A | Discharge status: Home / Self Care | Payer: BC Managed Care – PPO | Source: Ambulatory Visit | Attending: Family | Admitting: Family

## 2020-03-18 VITALS — BP 122/86 | HR 95 | Temp 98.6°F | Ht 68.0 in | Wt 171.6 lb

## 2020-03-18 DIAGNOSIS — Z23 Encounter for immunization: Secondary | ICD-10-CM | POA: Diagnosis not present

## 2020-03-18 DIAGNOSIS — Z720 Tobacco use: Secondary | ICD-10-CM | POA: Diagnosis not present

## 2020-03-18 DIAGNOSIS — Z136 Encounter for screening for cardiovascular disorders: Secondary | ICD-10-CM | POA: Diagnosis not present

## 2020-03-18 DIAGNOSIS — Z202 Contact with and (suspected) exposure to infections with a predominantly sexual mode of transmission: Secondary | ICD-10-CM | POA: Insufficient documentation

## 2020-03-18 DIAGNOSIS — Z7189 Other specified counseling: Secondary | ICD-10-CM

## 2020-03-18 DIAGNOSIS — Z0001 Encounter for general adult medical examination with abnormal findings: Secondary | ICD-10-CM | POA: Diagnosis not present

## 2020-03-18 DIAGNOSIS — Z0000 Encounter for general adult medical examination without abnormal findings: Secondary | ICD-10-CM

## 2020-03-18 NOTE — Progress Notes (Signed)
Subjective:    Patient ID: Lawrence Mendoza, male    DOB: 06-Jan-1991, 29 y.o.   MRN: [id number]  Chief Complaint  Patient presents with  . Annual Exam    HPI PT presents to the office today for CPE. He is followed by a Urologists for a kidney stones. States he had an xray on 03/15/20 and showed he still has a 5 mm and had a lithotripsy performed on Monday. He states his pain is a 0 right now, but yesterday was a 6 out of 10.   Pt denies any headache, palpitations, SOB, or edema at this time.     Review of Systems  All other systems reviewed and are negative.   Family History  Problem Relation Age of Onset  . Hyperlipidemia Father   . Hypertension Father   . Hyperlipidemia Paternal Grandfather    Social History   Socioeconomic History  . Marital status: Single    Spouse name: Not on file  . Number of children: Not on file  . Years of education: Not on file  . Highest education level: Not on file  Occupational History  . Not on file  Tobacco Use  . Smoking status: Current Some Day Smoker  . Smokeless tobacco: Former Network engineer  . Vaping Use: Every day  Substance and Sexual Activity  . Alcohol use: Yes    Comment: socially  . Drug use: No  . Sexual activity: Not on file  Other Topics Concern  . Not on file  Social History Narrative   Single, no children.   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   Lives in [place], [place].   Smoked cigarettes for a few years on and off, quit for good 07/2010.   No alcohol or drug use.   Social Determinants of Health   Financial Resource Strain:   . Difficulty of Paying Living Expenses: Not on file  Food Insecurity:   . Worried About Charity fundraiser in the Last Year: Not on file  . Ran Out of Food in the Last Year: Not on file  Transportation Needs:   . Lack of Transportation (Medical): Not on file  . Lack of Transportation (Non-Medical): Not on file  Physical Activity:   .  Days of Exercise per Week: Not on file  . Minutes of Exercise per Session: Not on file  Stress:   . Feeling of Stress : Not on file  Social Connections:   . Frequency of Communication with Friends and Family: Not on file  . Frequency of Social Gatherings with Friends and Family: Not on file  . Attends Religious Services: Not on file  . Active Member of Clubs or Organizations: Not on file  . Attends Archivist Meetings: Not on file  . Marital Status: Not on file  Intimate Partner Violence:   . Fear of Current or Ex-Partner: Not on file  . Emotionally Abused: Not on file  . Physically Abused: Not on file  . Sexually Abused: Not on file       Objective:   Physical Exam Vitals reviewed.  Constitutional:      General: He is not in acute distress.    Appearance: He is well-developed.  HENT:     Head: Normocephalic.     Right Ear: Tympanic membrane normal.     Left Ear: Tympanic membrane normal.  Eyes:     General:  Right eye: No discharge.        Left eye: No discharge.     Pupils: Pupils are equal, round, and reactive to light.  Neck:     Thyroid: No thyromegaly.  Cardiovascular:     Rate and Rhythm: Normal rate and regular rhythm.     Heart sounds: Normal heart sounds. No murmur heard.   Pulmonary:     Effort: Pulmonary effort is normal. No respiratory distress.     Breath sounds: Normal breath sounds. No wheezing.  Abdominal:     General: Bowel sounds are normal. There is no distension.     Palpations: Abdomen is soft.     Tenderness: There is no abdominal tenderness.  Musculoskeletal:        General: No tenderness. Normal range of motion.     Cervical back: Normal range of motion and neck supple.  Skin:    General: Skin is warm and dry.     Findings: No erythema or rash.  Neurological:     Mental Status: He is alert and oriented to person, place, and time.     Cranial Nerves: No cranial nerve deficit.     Deep Tendon Reflexes: Reflexes are normal  and symmetric.  Psychiatric:        Behavior: Behavior normal.        Thought Content: Thought content normal.        Judgment: Judgment normal.       BP 122/86   Pulse 95   Temp 98.6 F (37 C) (Temporal)   Ht $R'5\' 8"'pb$  (1.727 m)   Wt 171 lb 9.6 oz (77.8 kg)   SpO2 96%   BMI 26.09 kg/m      Assessment & Plan:  [name] comes in today with chief complaint of Annual Exam   Diagnosis and orders addressed:  1. Annual physical exam - CMP14+EGFR - CBC with Differential/Platelet - Lipid panel - TSH - STD Screen (8)  2. Current every day nicotine vaping - CMP14+EGFR - CBC with Differential/Platelet  3. Possible exposure to STD - STD Screen (8) - Urine cytology ancillary only   4. Educated about COVID-19 virus infection Pt will plan to get COVID vaccine soon  Labs pending Health Maintenance reviewed- TDAP given  Diet and exercise encouraged  Follow up plan: 1 year   Evelina Dun, FNP

## 2020-03-18 NOTE — Addendum Note (Signed)
Addended by: Ladean Raya on: 03/18/2020 04:12 PM   Modules accepted: Orders

## 2020-03-18 NOTE — Patient Instructions (Signed)

## 2020-03-19 LAB — CMP14+EGFR
ALT: 21 IU/L (ref 0–44)
AST: 15 IU/L (ref 0–40)
Albumin/Globulin Ratio: 2.3 — ABNORMAL HIGH (ref 1.2–2.2)
Albumin: 5.4 g/dL — ABNORMAL HIGH (ref 4.1–5.2)
Alkaline Phosphatase: 95 IU/L (ref 48–121)
BUN/Creatinine Ratio: 10 (ref 9–20)
BUN: 9 mg/dL (ref 6–20)
Bilirubin Total: 0.6 mg/dL (ref 0.0–1.2)
CO2: 23 mmol/L (ref 20–29)
Calcium: 10.1 mg/dL (ref 8.7–10.2)
Chloride: 99 mmol/L (ref 96–106)
Creatinine, Ser: 0.94 mg/dL (ref 0.76–1.27)
GFR calc Af Amer: 126 mL/min/{1.73_m2} (ref >59)
GFR calc non Af Amer: 109 mL/min/{1.73_m2} (ref >59)
Globulin, Total: 2.4 g/dL (ref 1.5–4.5)
Glucose: 98 mg/dL (ref 65–99)
Potassium: 4.3 mmol/L (ref 3.5–5.2)
Sodium: 140 mmol/L (ref 134–144)
Total Protein: 7.8 g/dL (ref 6.0–8.5)

## 2020-03-19 LAB — STD SCREEN (8)
HIV Screen 4th Generation wRfx: NONREACTIVE
HSV 1 Glycoprotein G Ab, IgG: <0.91 index (ref 0.00–0.90)
HSV 2 IgG, Type Spec: <0.91 index (ref 0.00–0.90)
Hep A IgM: NEGATIVE
Hep B C IgM: NEGATIVE
Hep C Virus Ab: 2.4 s/co ratio — ABNORMAL HIGH (ref 0.0–0.9)
Hepatitis B Surface Ag: NEGATIVE
RPR Ser Ql: NONREACTIVE

## 2020-03-19 LAB — CBC WITH DIFFERENTIAL/PLATELET
Basophils Absolute: 0.1 10*3/uL (ref 0.0–0.2)
Basos: 1 %
EOS (ABSOLUTE): 0.2 10*3/uL (ref 0.0–0.4)
Eos: 3 %
Hematocrit: 49.8 % (ref 37.5–51.0)
Hemoglobin: 16.8 g/dL (ref 13.0–17.7)
Immature Grans (Abs): 0 10*3/uL (ref 0.0–0.1)
Immature Granulocytes: 0 %
Lymphocytes Absolute: 2.4 10*3/uL (ref 0.7–3.1)
Lymphs: 30 %
MCH: 29.3 pg (ref 26.6–33.0)
MCHC: 33.7 g/dL (ref 31.5–35.7)
MCV: 87 fL (ref 79–97)
Monocytes Absolute: 0.8 10*3/uL (ref 0.1–0.9)
Monocytes: 10 %
Neutrophils Absolute: 4.4 10*3/uL (ref 1.4–7.0)
Neutrophils: 56 %
Platelets: 268 10*3/uL (ref 150–450)
RBC: 5.74 x10E6/uL (ref 4.14–5.80)
RDW: 12.2 % (ref 11.6–15.4)
WBC: 7.9 10*3/uL (ref 3.4–10.8)

## 2020-03-19 LAB — LIPID PANEL
Chol/HDL Ratio: 2.7 ratio (ref 0.0–5.0)
Cholesterol, Total: 212 mg/dL — ABNORMAL HIGH (ref 100–199)
HDL: 78 mg/dL (ref >39)
LDL Chol Calc (NIH): 125 mg/dL — ABNORMAL HIGH (ref 0–99)
Triglycerides: 48 mg/dL (ref 0–149)
VLDL Cholesterol Cal: 9 mg/dL (ref 5–40)

## 2020-03-19 LAB — TSH: TSH: 1.9 u[IU]/mL (ref 0.450–4.500)

## 2020-03-22 LAB — URINE CYTOLOGY ANCILLARY ONLY
Chlamydia: NEGATIVE
Comment: NEGATIVE
Comment: NEGATIVE
Comment: NORMAL
Neisseria Gonorrhea: NEGATIVE
Trichomonas: NEGATIVE

## 2020-03-31 DIAGNOSIS — N200 Calculus of kidney: Secondary | ICD-10-CM | POA: Diagnosis not present

## 2020-04-01 DIAGNOSIS — N200 Calculus of kidney: Secondary | ICD-10-CM | POA: Diagnosis not present

## 2020-04-26 ENCOUNTER — Ambulatory Visit: Payer: Self-pay | Admitting: Dermatology

## 2020-09-03 ENCOUNTER — Ambulatory Visit: Payer: BC Managed Care – PPO | Admitting: Nurse Practitioner

## 2020-09-03 ENCOUNTER — Other Ambulatory Visit: Payer: Self-pay

## 2020-09-03 ENCOUNTER — Encounter: Payer: Self-pay | Admitting: Nurse Practitioner

## 2020-09-03 VITALS — BP 132/83 | HR 84 | Temp 98.6°F | Ht 68.0 in | Wt 187.8 lb

## 2020-09-03 DIAGNOSIS — M25522 Pain in left elbow: Secondary | ICD-10-CM | POA: Insufficient documentation

## 2020-09-03 MED ORDER — IBUPROFEN 600 MG PO TABS: 600.0000 mg | ORAL_TABLET | Freq: Three times a day (TID) | ORAL | 0 refills | Status: DC | PRN
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
## 2020-09-03 NOTE — Patient Instructions (Signed)
Climber's Elbow  Climber's elbow is an elbow injury that results from long-lasting (chronic) inflammation or strain of the inner muscles in your upper arms (brachialis muscles). Your brachialis muscles help your arms bend (flex) at the elbow. This is a common overuse injury among climbers who constantly overstretch (overextend) or flex their arms over their heads. This condition is also called brachialis muscle strain or anterior capsule elbow sprain. This condition causes pain that gets worse over time and makes it difficult to straighten your arms. What are the causes? This condition is caused by chronic inflammation of the upper arm muscles from continuous overhead arm motion. This type of injury develops gradually over time (overuse injury) from repeated overextension or repetitive, forceful flexing of your upper arm muscles. This causes chronic inflammation in one or both muscles. Strained muscles may be twisted, pulled, or torn. The tissue that connects these muscles to bone (tendons) may also become inflamed (tendinitis). What increases the risk? You are more likely to develop this condition if you have a job or play a sport that requires constant overhead arm motions that involve repetitive flexing and extending (straightening) of the elbow. This includes being an athlete who:  Climbs.  Swims.  Plays tennis, baseball, or bowling.  Lifts weights. What are the signs or symptoms? Symptoms of this condition include:  Pain in the upper arm and elbow.  Arm pain that gets worse when flexing and straightening the elbow.  Redness or warmth at the front of the elbow.  A noise like a pop or a snap when moving or touching the elbow.  Muscle spasms in the upper arm.  Loss of arm strength or range of motion. How is this diagnosed? This condition is diagnosed based on:  Your symptoms.  Your medical history.  A physical exam. During the exam, you may be asked to move your hand, fingers,  wrist, and elbow in certain ways to help your health care provider find the source of your injury. You may also have imaging studies to confirm the diagnosis and to find out more about your condition, such as:  X-rays to check for broken bones.  An MRI to check for tears in the ligaments, muscles, or tendons. How is this treated? This condition is treated by:  Stopping or reducing all activities that cause arm or elbow pain until your symptoms go away.  Icing your elbow to relieve pain.  Taking NSAIDs to reduce pain and swelling.  Doing exercises (physical therapy) as instructed by your health care provider. Follow these instructions at home: Managing pain, stiffness, and swelling  If directed, put ice on the injured area. ? Put ice in a plastic bag. ? Place a towel between your skin and the bag. ? Leave the ice on for 20 minutes, 2-3 times a day.  Move your fingers often to reduce stiffness and swelling.  Raise (elevate) the injured area above the level of your heart while you are sitting or lying down.   Activity  Rest as told by your health care provider.  Do not lift anything that is heavier than the limit that you are told, until your health care provider says that it is safe.  Return to your normal activities as told by your health care provider. Ask your health care provider what activities are safe for you.  Do exercises as told by your health care provider. General instructions  Take over-the-counter and prescription medicines only as instructed by your health care provider.  Do  not use any products that contain nicotine or tobacco, such as cigarettes, e-cigarettes, and chewing tobacco. These can delay healing. If you need help quitting, ask your health care provider.  Keep all follow-up visits as instructed by your health care provider. This is important. How is this prevented?  Warm up and stretch before being active.  Cool down and stretch after being  active.  Give your body time to rest between periods of activity.  Make sure to use equipment that fits you.  Maintain physical fitness, including: ? Strength. ? Flexibility. ? Cardiovascular fitness. ? Endurance. Contact a health care provider if:  Your pain does not improve or it gets worse. Get help right away if:  You have severe pain, swelling, or numbness in your elbow, arm, or hand.  You cannot move your arm.  Your elbow looks like it has the wrong shape (looks deformed) or feels like it is sliding out of place. Summary  Climber's elbow is an elbow injury that results from long-lasting (chronic) inflammation or strain of the muscles in your upper arms (brachialis muscles).  This type of injury develops gradually over time (overuse injury) from repeated overstretching (overextension) or repetitive, forceful flexing (bending) of your upper arm muscles.  This condition is diagnosed based on your symptoms, your medical history, and a physical exam.  Return to your normal activities as told by your health care provider. Ask your health care provider what activities are safe for you. This information is not intended to replace advice given to you by your health care provider. Make sure you discuss any questions you have with your health care provider. Document Revised: 06/04/2018 Document Reviewed: 06/05/2018 Elsevier Patient Education  2021 [employer].
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
## 2020-09-27 ENCOUNTER — Ambulatory Visit: Payer: Self-pay | Admitting: Dermatology

## 2020-10-12 ENCOUNTER — Encounter: Payer: Self-pay | Admitting: Family

## 2020-10-12 ENCOUNTER — Ambulatory Visit: Payer: BC Managed Care – PPO | Admitting: Family

## 2020-10-12 ENCOUNTER — Other Ambulatory Visit: Payer: Self-pay

## 2020-10-12 VITALS — BP 122/73 | HR 75 | Temp 98.2°F | Ht 68.0 in | Wt 191.6 lb

## 2020-10-12 DIAGNOSIS — F17200 Nicotine dependence, unspecified, uncomplicated: Secondary | ICD-10-CM | POA: Diagnosis not present

## 2020-10-12 DIAGNOSIS — F1911 Other psychoactive substance abuse, in remission: Secondary | ICD-10-CM | POA: Diagnosis not present

## 2020-10-12 NOTE — Patient Instructions (Signed)
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
## 2020-10-19 ENCOUNTER — Emergency Department
Admission: EM | Admit: 2020-10-19 | Discharge: 2020-10-19 | Disposition: A | Discharge status: Home / Self Care | Payer: Worker's Compensation | Attending: Emergency Medicine | Admitting: Emergency Medicine

## 2020-10-19 ENCOUNTER — Emergency Department: Payer: Worker's Compensation

## 2020-10-19 ENCOUNTER — Encounter: Payer: Self-pay | Admitting: Emergency Medicine

## 2020-10-19 ENCOUNTER — Other Ambulatory Visit: Payer: Self-pay

## 2020-10-19 DIAGNOSIS — I10 Essential (primary) hypertension: Secondary | ICD-10-CM | POA: Diagnosis not present

## 2020-10-19 DIAGNOSIS — S0990XA Unspecified injury of head, initial encounter: Secondary | ICD-10-CM | POA: Insufficient documentation

## 2020-10-19 DIAGNOSIS — Y92410 Unspecified street and highway as the place of occurrence of the external cause: Secondary | ICD-10-CM | POA: Diagnosis not present

## 2020-10-19 DIAGNOSIS — S50812A Abrasion of left forearm, initial encounter: Secondary | ICD-10-CM | POA: Diagnosis not present

## 2020-10-19 DIAGNOSIS — S161XXA Strain of muscle, fascia and tendon at neck level, initial encounter: Secondary | ICD-10-CM | POA: Diagnosis not present

## 2020-10-19 DIAGNOSIS — S199XXA Unspecified injury of neck, initial encounter: Secondary | ICD-10-CM | POA: Diagnosis present

## 2020-10-19 DIAGNOSIS — T1490XA Injury, unspecified, initial encounter: Secondary | ICD-10-CM

## 2020-10-19 DIAGNOSIS — Y990 Civilian activity done for income or pay: Secondary | ICD-10-CM | POA: Insufficient documentation

## 2020-10-19 DIAGNOSIS — F17200 Nicotine dependence, unspecified, uncomplicated: Secondary | ICD-10-CM | POA: Diagnosis not present

## 2020-10-19 LAB — BASIC METABOLIC PANEL
Anion gap: 7 (ref 5–15)
BUN: 19 mg/dL (ref 6–20)
CO2: 26 mmol/L (ref 22–32)
Calcium: 9.5 mg/dL (ref 8.9–10.3)
Chloride: 105 mmol/L (ref 98–111)
Creatinine, Ser: 0.89 mg/dL (ref 0.61–1.24)
GFR, Estimated: >60 mL/min (ref >60)
Glucose, Bld: 109 mg/dL — ABNORMAL HIGH (ref 70–99)
Potassium: 3.9 mmol/L (ref 3.5–5.1)
Sodium: 138 mmol/L (ref 135–145)

## 2020-10-19 LAB — CBC WITH DIFFERENTIAL/PLATELET
Abs Immature Granulocytes: 0.02 10*3/uL (ref 0.00–0.07)
Basophils Absolute: 0 10*3/uL (ref 0.0–0.1)
Basophils Relative: 1 %
Eosinophils Absolute: 0.2 10*3/uL (ref 0.0–0.5)
Eosinophils Relative: 3 %
HCT: 44 % (ref 39.0–52.0)
Hemoglobin: 15.6 g/dL (ref 13.0–17.0)
Immature Granulocytes: 0 %
Lymphocytes Relative: 28 %
Lymphs Abs: 1.9 10*3/uL (ref 0.7–4.0)
MCH: 29.3 pg (ref 26.0–34.0)
MCHC: 35.5 g/dL (ref 30.0–36.0)
MCV: 82.6 fL (ref 80.0–100.0)
Monocytes Absolute: 0.7 10*3/uL (ref 0.1–1.0)
Monocytes Relative: 10 %
Neutro Abs: 4 10*3/uL (ref 1.7–7.7)
Neutrophils Relative %: 58 %
Platelets: 230 10*3/uL (ref 150–400)
RBC: 5.33 MIL/uL (ref 4.22–5.81)
RDW: 12.1 % (ref 11.5–15.5)
WBC: 6.9 10*3/uL (ref 4.0–10.5)
nRBC: 0 % (ref 0.0–0.2)

## 2020-10-19 LAB — URINALYSIS, COMPLETE (UACMP) WITH MICROSCOPIC
Bacteria, UA: NONE SEEN
Bilirubin Urine: NEGATIVE
Glucose, UA: NEGATIVE mg/dL
Hgb urine dipstick: NEGATIVE
Ketones, ur: NEGATIVE mg/dL
Leukocytes,Ua: NEGATIVE
Nitrite: NEGATIVE
Protein, ur: NEGATIVE mg/dL
Specific Gravity, Urine: >1.046 — ABNORMAL HIGH (ref 1.005–1.030)
Squamous Epithelial / HPF: NONE SEEN (ref 0–5)
pH: 8 (ref 5.0–8.0)

## 2020-10-19 MED ORDER — HYDROCODONE-ACETAMINOPHEN 5-325 MG PO TABS
1.0000 | ORAL_TABLET | Freq: Four times a day (QID) | ORAL | 0 refills | Status: DC | PRN
Start: 2020-10-19 — End: 2021-03-29

## 2020-10-19 MED ORDER — IOHEXOL 300 MG/ML  SOLN
100.0000 mL | Freq: Once | INTRAMUSCULAR | Status: AC | PRN
  Administered 2020-10-19: 100 mL via INTRAVENOUS
  Filled 2020-10-19: qty 100
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
## 2020-10-19 NOTE — ED Notes (Signed)
Left arm cleaned with ns and non stick dressing applied.

## 2020-10-19 NOTE — ED Notes (Signed)
Spoke with Lawrence Mendoza at [facility] this is w/c and he needs a drug screen  Pt does not have any ID on him at this time

## 2020-10-19 NOTE — ED Notes (Signed)
Workers Comp is complete.

## 2020-10-19 NOTE — ED Notes (Signed)
Says pain in neck, but mainly when he turns neck.  In nad
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
## 2020-10-20 ENCOUNTER — Telehealth: Payer: Self-pay

## 2020-10-20 NOTE — Telephone Encounter (Signed)
Transition Care Management Unsuccessful Follow-up Telephone Call  Date of discharge and from where:  10/19/2020 from [facility]  Attempts:  1st Attempt  Reason for unsuccessful TCM follow-up call:  Left voice message

## 2020-10-21 NOTE — Telephone Encounter (Signed)
Transition Care Management Unsuccessful Follow-up Telephone Call  Date of discharge and from where:  10/19/2020 from [facility]  Attempts:  2nd Attempt  Reason for unsuccessful TCM follow-up call:  Left voice message

## 2020-10-22 NOTE — Telephone Encounter (Signed)
Transition Care Management Follow-up Telephone Call  Date of discharge and from where: 10/19/2020 from [facility]  How have you been since you were released from the hospital? Pt stated that he is feeling better.   Any questions or concerns? No  Items Reviewed:  Did the pt receive and understand the discharge instructions provided? Yes   Medications obtained and verified? Yes   Other? No   Any new allergies since your discharge? No   Dietary orders reviewed? n/a  Do you have support at home? Yes   Functional Questionnaire: (I = Independent and D = Dependent) ADLs: I  Bathing/Dressing- I  Meal Prep- I  Eating- I  Maintaining continence- I  Transferring/Ambulation- I  Managing Meds- I   Follow up appointments reviewed:   PCP Hospital f/u appt confirmed? No    Specialist Hospital f/u appt confirmed? No    Are transportation arrangements needed? No   If their condition worsens, is the pt aware to call PCP or go to the Emergency Dept.? Yes  Was the patient provided with contact information for the PCP's office or ED? Yes  Was to pt encouraged to call back with questions or concerns? Yes

## 2021-03-23 ENCOUNTER — Ambulatory Visit (INDEPENDENT_AMBULATORY_CARE_PROVIDER_SITE_OTHER): Payer: BC Managed Care – PPO

## 2021-03-23 ENCOUNTER — Encounter: Payer: Self-pay | Admitting: Nurse Practitioner

## 2021-03-23 ENCOUNTER — Other Ambulatory Visit: Payer: Self-pay

## 2021-03-23 ENCOUNTER — Ambulatory Visit: Payer: BC Managed Care – PPO | Admitting: Nurse Practitioner

## 2021-03-23 VITALS — BP 128/88 | HR 83 | Temp 97.4°F | Ht 68.0 in | Wt 180.0 lb

## 2021-03-23 DIAGNOSIS — M25561 Pain in right knee: Secondary | ICD-10-CM | POA: Insufficient documentation

## 2021-03-23 DIAGNOSIS — M7989 Other specified soft tissue disorders: Secondary | ICD-10-CM | POA: Diagnosis not present

## 2021-03-23 DIAGNOSIS — W19XXXA Unspecified fall, initial encounter: Secondary | ICD-10-CM | POA: Diagnosis not present

## 2021-03-23 MED ORDER — NAPROXEN 500 MG PO TABS: 500.0000 mg | ORAL_TABLET | Freq: Two times a day (BID) | ORAL | 0 refills | Status: DC
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
## 2021-03-23 NOTE — Patient Instructions (Signed)
Knee Sprain, Adult A knee sprain is a stretch or tear in a knee ligament. Knee ligaments are tissues that connect bones in the knee to each other. What are the causes? This condition often results from: A fall. An injury to the knee. What are the signs or symptoms? Symptoms of this condition include: Trouble straightening or bending the leg. Swelling in the knee. Bruising around the knee. Tenderness or pain in the knee. Muscle spasms around the knee. How is this diagnosed? This condition may be diagnosed based on: A physical exam. A history of what happened just before you started to have symptoms. Tests, including: An X-ray. This may be done to make sure no bones are broken. An MRI. This may be done to check if the ligament is torn. Stress testing of the knee. This may be done to check ligament damage. How is this treated? Treatment for this condition may involve: Keeping the knee still (immobilized) with a cast, brace, or splint. Applying ice to the knee. This helps with pain and swelling. Raising (elevating) the knee above the level of your heart when you are resting. This helps with pain and swelling. Taking medicine for pain. Doing exercises to prevent or limit permanent weakness or stiffness in your knee. Having surgery to reconnect the ligament to the bone or to reconstruct it. This may be needed if the ligament is completely torn. Follow these instructions at home: If you have a splint or brace: Wear it as told by your health care provider. Remove it only as told by your health care provider. Check the skin around it every day. Tell your health care provider about any concerns. Loosen it if your toes tingle, become numb, or turn cold and blue. Keep it clean and dry. If you have a cast: Do not stick anything inside it to scratch your skin. Doing that increases your risk of infection. Check the skin around it every day. Tell your health care provider about any  concerns. You may put lotion on dry skin around the edges of the cast. Do not put lotion on the skin underneath the cast. Keep it clean and dry. Bathing If you have a splint, brace, or cast that is not waterproof: Do not let it get wet. Cover it with a watertight covering when you take a bath or a shower. Managing pain, stiffness, and swelling  If directed, put ice on the injured area. To do this: If you have a removable splint or brace, remove it as told by your health care provider. Put ice in a plastic bag. Place a towel between your skin and the bag or between your cast and the bag. Leave the ice on for 20 minutes, 2-3 times a day. Move your toes often to reduce stiffness and swelling. Elevate the injured area above the level of your heart while you are sitting or lying down. General instructions Take over-the-counter and prescription medicines only as told by your health care provider. Do not use any products that contain nicotine or tobacco, such as cigarettes, e-cigarettes, and chewing tobacco. These can delay healing. If you need help quitting, ask your health care provider. Do exercises as told by your health care provider. Keep all follow-up visits as told by your health care provider. This is important. Contact a health care provider if: You have pain that gets worse. The cast, brace, or splint does not fit right. The cast, brace, or splint gets damaged. Get help right away if: You cannot use  your injured knee to support any of your body weight (cannot bear weight). You cannot move the injured joint. You cannot walk more than a few steps without pain or without your knee buckling. You have significant pain, swelling, or numbness in the leg below the cast, brace, or splint. Your foot or toes are numb, cold, or blue after loosening your splint or brace. Summary A knee sprain is a stretch or tear in a knee ligament that usually occurs as the result of a fall or  injury. Treatment may involve immobilizing the knee with a cast, splint, or brace and then doing exercises. If the ligament is completely torn, it may require surgery to repair or replace the injured ligament. This information is not intended to replace advice given to you by your health care provider. Make sure you discuss any questions you have with your health care provider. Document Revised: 05/30/2019 Document Reviewed: 05/30/2019 Elsevier Patient Education  [place].

## 2021-03-23 NOTE — Assessment & Plan Note (Signed)
[place]  [place] knee x-ray results pending.  Naproxen for pain continue to ice joint for the next 24 to 48 hours.  Immobilize knee, follow-up with worsening unresolved symptoms.   Rx sent to pharmacy.

## 2021-03-23 NOTE — Assessment & Plan Note (Signed[place].  Pain is mild he rates it 4 out of 10 on the pain scale of 0-10.  Completed right knee x-ray results pending.  Naproxen 500 mg twice daily.  Rx sent to pharmacy.  Education provided to patient printed handouts given.  Follow-up for worsening unresolved symptoms.

## 2021-03-24 ENCOUNTER — Telehealth: Payer: Self-pay | Admitting: Nurse Practitioner

## 2021-03-24 NOTE — Telephone Encounter (Signed)
Please advise and send back to pools

## 2021-03-25 ENCOUNTER — Encounter: Payer: Self-pay | Admitting: Family Medicine

## 2021-03-25 NOTE — Telephone Encounter (Signed)
Pt is checking on work note. He needs today

## 2021-03-25 NOTE — Telephone Encounter (Signed)
Patient notified and emailed to patient

## 2021-03-25 NOTE — Telephone Encounter (Signed)
Yes okay to go ahead and do a work note saying that he can go back with no restrictions, I do not know if he works over the weekend but if he does not then you can put it for Tuesday to go back.  If he wants to go back tomorrow that is fine to make it for tomorrow to go back.

## 2021-03-29 ENCOUNTER — Other Ambulatory Visit: Payer: Self-pay

## 2021-03-29 ENCOUNTER — Encounter: Payer: Self-pay | Admitting: Family

## 2021-03-29 ENCOUNTER — Ambulatory Visit (INDEPENDENT_AMBULATORY_CARE_PROVIDER_SITE_OTHER): Payer: BC Managed Care – PPO | Admitting: Family

## 2021-03-29 VITALS — BP 118/75 | HR 82 | Temp 98.3°F | Ht 68.0 in | Wt 184.8 lb

## 2021-03-29 DIAGNOSIS — F17200 Nicotine dependence, unspecified, uncomplicated: Secondary | ICD-10-CM

## 2021-03-29 DIAGNOSIS — E785 Hyperlipidemia, unspecified: Secondary | ICD-10-CM | POA: Diagnosis not present

## 2021-03-29 DIAGNOSIS — Z0000 Encounter for general adult medical examination without abnormal findings: Secondary | ICD-10-CM

## 2021-03-29 DIAGNOSIS — Z0001 Encounter for general adult medical examination with abnormal findings: Secondary | ICD-10-CM

## 2021-03-29 NOTE — Patient Instructions (Signed)
Health Maintenance, Male Adopting a healthy lifestyle and getting preventive care are important in promoting health and wellness. Ask your health care provider about: The right schedule for you to have regular tests and exams. Things you can do on your own to prevent diseases and keep yourself healthy. What should I know about diet, weight, and exercise? Eat a healthy diet  Eat a diet that includes plenty of vegetables, fruits, low-fat dairy products, and lean protein. Do not eat a lot of foods that are high in solid fats, added sugars, or sodium. Maintain a healthy weight Body mass index (BMI) is a measurement that can be used to identify possible weight problems. It estimates body fat based on height and weight. Your health care provider can help determine your BMI and help you achieve or maintain a healthy weight. Get regular exercise Get regular exercise. This is one of the most important things you can do for your health. Most adults should: Exercise for at least 150 minutes each week. The exercise should increase your heart rate and make you sweat (moderate-intensity exercise). Do strengthening exercises at least twice a week. This is in addition to the moderate-intensity exercise. Spend less time sitting. Even light physical activity can be beneficial. Watch cholesterol and blood lipids Have your blood tested for lipids and cholesterol at 30 years of age, then have this test every 5 years. You may need to have your cholesterol levels checked more often if: Your lipid or cholesterol levels are high. You are older than 30 years of age. You are at high risk for heart disease. What should I know about cancer screening? Many types of cancers can be detected early and may often be prevented. Depending on your health history and family history, you may need to have cancer screening at various ages. This may include screening for: Colorectal cancer. Prostate cancer. Skin cancer. Lung  cancer. What should I know about heart disease, diabetes, and high blood pressure? Blood pressure and heart disease High blood pressure causes heart disease and increases the risk of stroke. This is more likely to develop in people who have high blood pressure readings, are of African descent, or are overweight. Talk with your health care provider about your target blood pressure readings. Have your blood pressure checked: Every 3-5 years if you are 18-39 years of age. Every year if you are 40 years old or older. If you are between the ages of 65 and 75 and are a current or former smoker, ask your health care provider if you should have a one-time screening for abdominal aortic aneurysm (AAA). Diabetes Have regular diabetes screenings. This checks your fasting blood sugar level. Have the screening done: Once every three years after age 45 if you are at a normal weight and have a low risk for diabetes. More often and at a younger age if you are overweight or have a high risk for diabetes. What should I know about preventing infection? Hepatitis B If you have a higher risk for hepatitis B, you should be screened for this virus. Talk with your health care provider to find out if you are at risk for hepatitis B infection. Hepatitis C Blood testing is recommended for: Everyone born from 1945 through 1965. Anyone with known risk factors for hepatitis C. Sexually transmitted infections (STIs) You should be screened each year for STIs, including gonorrhea and chlamydia, if: You are sexually active and are younger than 30 years of age. You are older than 30 years   of age and your health care provider tells you that you are at risk for this type of infection. Your sexual activity has changed since you were last screened, and you are at increased risk for chlamydia or gonorrhea. Ask your health care provider if you are at risk. Ask your health care provider about whether you are at high risk for HIV.  Your health care provider may recommend a prescription medicine to help prevent HIV infection. If you choose to take medicine to prevent HIV, you should first get tested for HIV. You should then be tested every 3 months for as long as you are taking the medicine. Follow these instructions at home: Lifestyle Do not use any products that contain nicotine or tobacco, such as cigarettes, e-cigarettes, and chewing tobacco. If you need help quitting, ask your health care provider. Do not use street drugs. Do not share needles. Ask your health care provider for help if you need support or information about quitting drugs. Alcohol use Do not drink alcohol if your health care provider tells you not to drink. If you drink alcohol: Limit how much you have to 0-2 drinks a day. Be aware of how much alcohol is in your drink. In the [place] one drink equals one 12 oz bottle of beer (355 mL), one 5 oz glass of wine (148 mL), or one 1 oz glass of hard liquor (44 mL). General instructions Schedule regular health, dental, and eye exams. Stay current with your vaccines. Tell your health care provider if: You often feel depressed. You have ever been abused or do not feel safe at home. Summary Adopting a healthy lifestyle and getting preventive care are important in promoting health and wellness. Follow your health care provider's instructions about healthy diet, exercising, and getting tested or screened for diseases. Follow your health care provider's instructions on monitoring your cholesterol and blood pressure. This information is not intended to replace advice given to you by your health care provider. Make sure you discuss any questions you have with your health care provider. Document Revised: 09/17/2020 Document Reviewed: 07/03/2018 Elsevier Patient Education  2022 Elsevier Inc.  

## 2021-03-29 NOTE — Progress Notes (Signed)
Subjective:    Patient ID: Lawrence Mendoza, male    DOB: Jun 11, 1991, 30 y.o.   MRN: [id number]  Chief Complaint  Patient presents with   Annual Exam    HPI Pt presents to the office today for CPE. Pt denies any headache, palpitations, SOB, or edema at this time.   Pt states he completed the naproxen for right knee pain that resolved.   He continues to vape daily.   Review of Systems  All other systems reviewed and are negative.     Family History  Problem Relation Age of Onset   Hyperlipidemia Father    Hypertension Father    Hyperlipidemia Paternal Grandfather    Social History   Socioeconomic History   Marital status: Single    Spouse name: Not on file   Number of children: Not on file   Years of education: Not on file   Highest education level: Not on file  Occupational History   Not on file  Tobacco Use   Smoking status: Some Days   Smokeless tobacco: Former  Scientific laboratory technician Use: Every day  Substance and Sexual Activity   Alcohol use: Yes    Comment: socially   Drug use: No   Sexual activity: Not on file  Other Topics Concern   Not on file  Social History Narrative   Single, no children.   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   Lives in [place], [place].   Smoked cigarettes for a few years on and off, quit for good 07/2010.   No alcohol or drug use.   Social Determinants of Health   Financial Resource Strain: Not on file  Food Insecurity: Not on file  Transportation Needs: Not on file  Physical Activity: Not on file  Stress: Not on file  Social Connections: Not on file    Objective:   Physical Exam Vitals reviewed.  Constitutional:      General: He is not in acute distress.    Appearance: He is well-developed.  HENT:     Head: Normocephalic.     Right Ear: Tympanic membrane normal.     Left Ear: Tympanic membrane normal.  Eyes:     General:        Right eye: No discharge.        Left eye: No  discharge.     Pupils: Pupils are equal, round, and reactive to light.  Neck:     Thyroid: No thyromegaly.  Cardiovascular:     Rate and Rhythm: Normal rate and regular rhythm.     Heart sounds: Normal heart sounds. No murmur heard. Pulmonary:     Effort: Pulmonary effort is normal. No respiratory distress.     Breath sounds: Normal breath sounds. No wheezing.  Abdominal:     General: Bowel sounds are normal. There is no distension.     Palpations: Abdomen is soft.     Tenderness: There is no abdominal tenderness.  Musculoskeletal:        General: No tenderness. Normal range of motion.     Cervical back: Normal range of motion and neck supple.  Skin:    General: Skin is warm and dry.     Findings: No erythema or rash.  Neurological:     Mental Status: He is alert and oriented to person, place, and time.     Cranial Nerves: No cranial nerve deficit.     Deep Tendon Reflexes: Reflexes are  normal and symmetric.  Psychiatric:        Behavior: Behavior normal.        Thought Content: Thought content normal.        Judgment: Judgment normal.         BP 118/75   Pulse 82   Temp 98.3 F (36.8 C) (Temporal)   Ht $R'5\' 8"'QB$  (1.727 m)   Wt 184 lb 12.8 oz (83.8 kg)   BMI 28.10 kg/m   Assessment & Plan:  Lawrence Mendoza comes in today with chief complaint of Annual Exam   Diagnosis and orders addressed:  1. Annual physical exam - CMP14+EGFR - CBC with Differential/Platelet - Lipid panel - TSH  2. Current smoker - CMP14+EGFR - CBC with Differential/Platelet   Labs pending Health Maintenance reviewed Diet and exercise encouraged  Follow up plan: 1 year    Evelina Dun, FNP

## 2021-03-30 LAB — LIPID PANEL
Chol/HDL Ratio: 4.1 ratio (ref 0.0–5.0)
Cholesterol, Total: 224 mg/dL — ABNORMAL HIGH (ref 100–199)
HDL: 54 mg/dL (ref >39)
LDL Chol Calc (NIH): 150 mg/dL — ABNORMAL HIGH (ref 0–99)
Triglycerides: 113 mg/dL (ref 0–149)
VLDL Cholesterol Cal: 20 mg/dL (ref 5–40)

## 2021-03-30 LAB — CBC WITH DIFFERENTIAL/PLATELET
Basophils Absolute: 0.1 10*3/uL (ref 0.0–0.2)
Basos: 1 %
EOS (ABSOLUTE): 0.2 10*3/uL (ref 0.0–0.4)
Eos: 2 %
Hematocrit: 46 % (ref 37.5–51.0)
Hemoglobin: 15.8 g/dL (ref 13.0–17.7)
Immature Grans (Abs): 0 10*3/uL (ref 0.0–0.1)
Immature Granulocytes: 0 %
Lymphocytes Absolute: 2.3 10*3/uL (ref 0.7–3.1)
Lymphs: 24 %
MCH: 28.9 pg (ref 26.6–33.0)
MCHC: 34.3 g/dL (ref 31.5–35.7)
MCV: 84 fL (ref 79–97)
Monocytes Absolute: 0.9 10*3/uL (ref 0.1–0.9)
Monocytes: 9 %
Neutrophils Absolute: 5.9 10*3/uL (ref 1.4–7.0)
Neutrophils: 64 %
Platelets: 266 10*3/uL (ref 150–450)
RBC: 5.46 x10E6/uL (ref 4.14–5.80)
RDW: 12.2 % (ref 11.6–15.4)
WBC: 9.3 10*3/uL (ref 3.4–10.8)

## 2021-03-30 LAB — CMP14+EGFR
ALT: 15 IU/L (ref 0–44)
AST: 13 IU/L (ref 0–40)
Albumin/Globulin Ratio: 1.9 (ref 1.2–2.2)
Albumin: 4.8 g/dL (ref 4.1–5.2)
Alkaline Phosphatase: 90 IU/L (ref 44–121)
BUN/Creatinine Ratio: 15 (ref 9–20)
BUN: 14 mg/dL (ref 6–20)
Bilirubin Total: 0.5 mg/dL (ref 0.0–1.2)
CO2: 26 mmol/L (ref 20–29)
Calcium: 9.8 mg/dL (ref 8.7–10.2)
Chloride: 103 mmol/L (ref 96–106)
Creatinine, Ser: 0.94 mg/dL (ref 0.76–1.27)
Globulin, Total: 2.5 g/dL (ref 1.5–4.5)
Glucose: 98 mg/dL (ref 65–99)
Potassium: 4 mmol/L (ref 3.5–5.2)
Sodium: 144 mmol/L (ref 134–144)
Total Protein: 7.3 g/dL (ref 6.0–8.5)
eGFR: 112 mL/min/{1.73_m2} (ref >59)

## 2021-03-30 LAB — TSH: TSH: 2.21 u[IU]/mL (ref 0.450–4.500)
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
## 2021-07-28 ENCOUNTER — Ambulatory Visit: Payer: BC Managed Care – PPO | Admitting: Family

## 2021-07-28 ENCOUNTER — Ambulatory Visit (INDEPENDENT_AMBULATORY_CARE_PROVIDER_SITE_OTHER): Payer: BC Managed Care – PPO | Admitting: Family

## 2021-07-28 ENCOUNTER — Telehealth: Payer: Self-pay | Admitting: Family

## 2021-07-28 ENCOUNTER — Encounter: Payer: Self-pay | Admitting: Family

## 2021-07-28 DIAGNOSIS — M542 Cervicalgia: Secondary | ICD-10-CM

## 2021-07-28 DIAGNOSIS — S139XXA Sprain of joints and ligaments of unspecified parts of neck, initial encounter: Secondary | ICD-10-CM

## 2021-07-28 MED ORDER — PREDNISONE 20 MG PO TABS: 40.0000 mg | ORAL_TABLET | Freq: Every day | ORAL | 0 refills | Status: AC

## 2021-07-28 MED ORDER — DICLOFENAC SODIUM 75 MG PO TBEC: 75.0000 mg | DELAYED_RELEASE_TABLET | Freq: Two times a day (BID) | ORAL | 0 refills | Status: DC

## 2021-07-28 MED ORDER — BACLOFEN 10 MG PO TABS: 10.0000 mg | ORAL_TABLET | Freq: Three times a day (TID) | ORAL | 0 refills | Status: DC

## 2021-07-28 NOTE — Telephone Encounter (Signed)
Pt called back. Employer needs work note for pt  to go back tomorrow with no restrictions. Email to [email or web address]

## 2021-07-28 NOTE — Patient Instructions (Signed)
Cervical Sprain A cervical sprain is a stretch or tear in one or more of the ligaments in the neck. Ligaments are the tissues that connect bones. Cervical sprains can range from mild to severe. Severe cervical sprains can cause the spinal bones (vertebrae) in the neck to be unstable. This can result in spinal cord damage and in serious nervous system problems. The time that it takes for a cervical sprain to heal depends on the cause and extent of the injury. Most cervical sprains heal in 4-6 weeks. What are the causes? Cervical sprains may be caused by trauma, such as an injury from a motor vehicle accident, a fall, or a sudden forward and backward whipping movement of the head and neck (whiplash injury). Mild cervical sprains may be caused by wear and tear over time. What increases the risk? The following factors may make you more likely to develop this condition: Participating in activities that have a high risk of trauma to the neck. These include contact sports, auto racing, gymnastics, and diving. Taking risks when driving or riding in a motor vehicle. Osteoarthritis of the spine. Poor strength and flexibility of the neck. A previous neck injury. Poor posture. Spending long periods in certain positions that put stress on the neck, such as sitting at a computer for a long time. What are the signs or symptoms? Symptoms of this condition include: Pain, soreness, stiffness, tenderness, swelling, or a burning sensation in the front, back, or sides of the neck, shoulders, or upper back. Sudden tightening of neck muscles (spasms). Limited ability to move the neck. Headache. Dizziness. Nausea or vomiting. Weakness, numbness, or tingling in a hand or an arm. Symptoms may develop right away after injury, or they may develop over a few days. In some cases, symptoms may go away with treatment and return (recur) over time. How is this diagnosed? This condition may be diagnosed based on: Your  medical history. Your symptoms. Any recent injuries or known neck problems that you have, such as arthritis in the neck. A physical exam. Imaging tests, such as X-rays, MRI, and CT scan. How is this treated? This condition is treated by resting and icing the injured area and doing physical therapy exercises. Heat therapy may be used 2-3 days after the injury occurred if there is no swelling. Depending on the severity of your condition, treatment may also include: Keeping your neck in place (immobilized) for periods of time. This may be done using: A cervical collar. This supports your chin and the back of your head. A cervical traction device. This is a sling that holds up your head. The device removes weight and pressure from your neck, and it may help to relieve pain. Medicines that help to relieve pain and inflammation. Medicines that help to relax your muscles (muscle relaxants). Surgery. This is rare. Follow these instructions at home: Medicines  Take over-the-counter and prescription medicines only as told by your health care provider. Ask your health care provider if the medicine prescribed to you: Requires you to avoid driving or using heavy machinery. Can cause constipation. You may need to take these actions to prevent or treat constipation: Drink enough fluid to keep your urine pale yellow. Take over-the-counter or prescription medicines. Eat foods that are high in fiber, such as beans, whole grains, and fresh fruits and vegetables. Limit foods that are high in fat and processed sugars, such as fried or sweet foods. If you have a cervical collar: Wear the collar as told by your   health care provider. Do not remove it unless told. Ask before making any adjustments to your collar. If you have long hair, keep it outside of the collar. Ask your health care provider if you may remove the collar for cleaning and bathing. If so: Follow instructions about how to remove it  safely. Clean it by hand with mild soap and water and air-dry it completely. If your collar has removable pads, remove them every 1-2 days and wash them by hand with soap and water. Let them air-dry completely before putting them back in the collar. Tell your health care provider if your skin under the collar has irritation or sores. Managing pain, stiffness, and swelling   If directed, use a cervical traction device as told. If directed, put ice on the affected area. To do this: Put ice in a plastic bag. Place a towel between your skin and the bag. Leave the ice on for 20 minutes, 2-3 times a day. If directed, apply heat to the affected area before you do your physical therapy or as often as told by your health care provider. Use the heat source that your health care provider recommends, such as a moist heat pack or a heating pad. Place a towel between your skin and the heat source. Leave the heat on for 20-30 minutes. Remove the heat if your skin turns bright red. This is especially important if you are unable to feel pain, heat, or cold. You may have a greater risk of getting burned. Activity Do not drive while wearing a cervical collar. If you do not have a cervical collar, ask if it is safe to drive while your neck heals. Do not lift anything that is heavier than 10 lb (4.5 kg), or the limit that you are told, until your health care provider says that it is safe. Rest as told by your health care provider. If physical therapy was prescribed, do exercises as told by your health care provider or physical therapist. Return to your normal activities as told by your health care provider. Avoid positions and activities that make your symptoms worse. Ask your health care provider what activities are safe for you. General instructions Do not use any products that contain nicotine or tobacco, such as cigarettes, e-cigarettes, and chewing tobacco. These can delay healing. If you need help quitting,  ask your health care provider. Keep all follow-up visits as told by your health care provider or physical therapist. This is important. How is this prevented? To prevent a cervical sprain from happening again: Use and maintain good posture. Make any needed adjustments to your workstation to help you do this. Exercise regularly as told by your health care provider or physical therapist. Avoid risky activities that may cause a cervical sprain. Contact a health care provider if you have: Symptoms that get worse or do not get better after 2 weeks of treatment. Pain that gets worse or does not get better with medicine. New, unexplained symptoms. Sores or irritated skin on your neck from wearing your cervical collar. Get help right away if: You have severe pain. You develop numbness, tingling, or weakness in any part of your body. You cannot move a part of your body (you have paralysis). You have neck pain along with severe dizziness or headache. Summary A cervical sprain is a stretch or tear in one or more of the ligaments in the neck. Cervical sprains may be caused by trauma, such as an injury from a motor vehicle accident, a   fall, or a sudden forward and backward whipping movement of the head and neck (whiplash injury). Symptoms may develop right away after injury, or they may develop over a few days. This condition may be treated with rest, ice, heat, medicines, physical therapy, and surgery. This information is not intended to replace advice given to you by your health care provider. Make sure you discuss any questions you have with your health care provider. Document Revised: 03/19/2019 Document Reviewed: 03/19/2019 Elsevier Patient Education  2022 [employer]  

## 2021-07-28 NOTE — Telephone Encounter (Signed)
Done

## 2021-07-28 NOTE — Telephone Encounter (Signed)
Letter emailed
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
## 2021-08-03 ENCOUNTER — Ambulatory Visit (INDEPENDENT_AMBULATORY_CARE_PROVIDER_SITE_OTHER): Payer: BC Managed Care – PPO

## 2021-08-03 ENCOUNTER — Ambulatory Visit: Payer: BC Managed Care – PPO | Admitting: Family Medicine

## 2021-08-03 ENCOUNTER — Encounter: Payer: Self-pay | Admitting: Family Medicine

## 2021-08-03 VITALS — BP 165/100 | HR 89 | Temp 98.0°F | Ht 68.0 in | Wt 184.0 lb

## 2021-08-03 DIAGNOSIS — R030 Elevated blood-pressure reading, without diagnosis of hypertension: Secondary | ICD-10-CM

## 2021-08-03 DIAGNOSIS — M5412 Radiculopathy, cervical region: Secondary | ICD-10-CM

## 2021-08-03 NOTE — Progress Notes (Signed)
Assessment & Plan:  1. Cervical radiculopathy Education provided on cervical radiculopathy.  Encourage patient to take prednisone as previously prescribed.  Cervical spine x-ray completed today. - DG Cervical Spine Complete  2. Elevated BP without diagnosis of hypertension Patient does not monitor his blood pressure at home.  His blood pressure readings have been normal prior to now.  I suspect his elevation is due to his current state of pain, but have asked him to return next week for a nurse visit to reassess.    Follow up plan: Return next week, for nurse visit to re-check BP.  Hendricks Limes, MSN, APRN, FNP-C [facility]  Subjective:   Patient ID: Lawrence Mendoza, male    DOB: 08/10/1990, 31 y.o.   MRN: [id number]  HPI: Lawrence Mendoza is a 31 y.o. male presenting on 08/03/2021 for Neck Pain (That will shoot pain to left shoulder and back x 1 day.)  Patient had a visit last week for neck pain on the right side.  He was prescribed prednisone, baclofen, and diclofenac.  States he did not take the prednisone as he has never taken steroids before and did not know how it affects him, but he did take the baclofen and diclofenac which resolved his symptoms.  Today he presents with left-sided neck pain that radiates to his left shoulder and down his left arm.  No known injuries.  Has been sleeping on the same bed with the same pillows for the past couple of years.  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   ROS: Negative unless specifically indicated above in HPI.   Relevant past medical history reviewed and updated as indicated.   Allergies and medications reviewed and updated.   Current Outpatient Medications:    baclofen (LIORESAL) 10 MG tablet, Take 1 tablet (10 mg total) by mouth 3 (three) times daily. (Patient not taking: Reported on 08/03/2021), Disp: 30 each, Rfl: 0   diclofenac  (VOLTAREN) 75 MG EC tablet, Take 1 tablet (75 mg total) by mouth 2 (two) times daily. (Patient not taking: Reported on 08/03/2021), Disp: 60 tablet, Rfl: 0  No Known Allergies  Objective:   BP (!) 165/100    Pulse 89    Temp 98 F (36.7 C) (Temporal)    Ht 5\' 8"  (1.727 m)    Wt 184 lb (83.5 kg)    BMI 27.98 kg/m    Physical Exam Vitals reviewed.  Constitutional:      General: He is not in acute distress.    Appearance: Normal appearance. He is not ill-appearing, toxic-appearing or diaphoretic.  HENT:     Head: Normocephalic and atraumatic.  Eyes:     General: No scleral icterus.       Right eye: No discharge.        Left eye: No discharge.     Conjunctiva/sclera: Conjunctivae normal.  Cardiovascular:     Rate and Rhythm: Normal rate.  Pulmonary:     Effort: Pulmonary effort is normal. No respiratory distress.  Musculoskeletal:     Cervical back: Tenderness (left side (muscular)) present. No signs of trauma or bony tenderness. Pain with movement and muscular tenderness present. No spinous process tenderness. Decreased range of motion.  Skin:    General: Skin is warm and dry.  Neurological:     Mental Status: He is alert and oriented to person, place, and time.  Mental status is at baseline.  Psychiatric:        Mood and Affect: Mood normal.        Behavior: Behavior normal.        Thought Content: Thought content normal.        Judgment: Judgment normal.

## 2022-04-07 ENCOUNTER — Encounter: Payer: BC Managed Care – PPO | Admitting: Family

## 2022-04-07 ENCOUNTER — Encounter: Payer: Self-pay | Admitting: Family

## 2022-04-07 ENCOUNTER — Ambulatory Visit (INDEPENDENT_AMBULATORY_CARE_PROVIDER_SITE_OTHER): Payer: BC Managed Care – PPO | Admitting: Family

## 2022-04-07 VITALS — BP 133/81 | HR 75 | Temp 98.2°F | Ht 68.0 in | Wt 171.8 lb

## 2022-04-07 DIAGNOSIS — Z0000 Encounter for general adult medical examination without abnormal findings: Secondary | ICD-10-CM | POA: Diagnosis not present

## 2022-04-07 DIAGNOSIS — Z136 Encounter for screening for cardiovascular disorders: Secondary | ICD-10-CM | POA: Diagnosis not present

## 2022-04-07 DIAGNOSIS — Z720 Tobacco use: Secondary | ICD-10-CM | POA: Diagnosis not present

## 2022-04-07 NOTE — Patient Instructions (Signed)
Health Maintenance, Male Adopting a healthy lifestyle and getting preventive care are important in promoting health and wellness. Ask your health care provider about: The right schedule for you to have regular tests and exams. Things you can do on your own to prevent diseases and keep yourself healthy. What should I know about diet, weight, and exercise? Eat a healthy diet  Eat a diet that includes plenty of vegetables, fruits, low-fat dairy products, and lean protein. Do not eat a lot of foods that are high in solid fats, added sugars, or sodium. Maintain a healthy weight Body mass index (BMI) is a measurement that can be used to identify possible weight problems. It estimates body fat based on height and weight. Your health care provider can help determine your BMI and help you achieve or maintain a healthy weight. Get regular exercise Get regular exercise. This is one of the most important things you can do for your health. Most adults should: Exercise for at least 150 minutes each week. The exercise should increase your heart rate and make you sweat (moderate-intensity exercise). Do strengthening exercises at least twice a week. This is in addition to the moderate-intensity exercise. Spend less time sitting. Even light physical activity can be beneficial. Watch cholesterol and blood lipids Have your blood tested for lipids and cholesterol at 31 years of age, then have this test every 5 years. You may need to have your cholesterol levels checked more often if: Your lipid or cholesterol levels are high. You are older than 31 years of age. You are at high risk for heart disease. What should I know about cancer screening? Many types of cancers can be detected early and may often be prevented. Depending on your health history and family history, you may need to have cancer screening at various ages. This may include screening for: Colorectal cancer. Prostate cancer. Skin cancer. Lung  cancer. What should I know about heart disease, diabetes, and high blood pressure? Blood pressure and heart disease High blood pressure causes heart disease and increases the risk of stroke. This is more likely to develop in people who have high blood pressure readings or are overweight. Talk with your health care provider about your target blood pressure readings. Have your blood pressure checked: Every 3-5 years if you are 18-39 years of age. Every year if you are 40 years old or older. If you are between the ages of 65 and 75 and are a current or former smoker, ask your health care provider if you should have a one-time screening for abdominal aortic aneurysm (AAA). Diabetes Have regular diabetes screenings. This checks your fasting blood sugar level. Have the screening done: Once every three years after age 45 if you are at a normal weight and have a low risk for diabetes. More often and at a younger age if you are overweight or have a high risk for diabetes. What should I know about preventing infection? Hepatitis B If you have a higher risk for hepatitis B, you should be screened for this virus. Talk with your health care provider to find out if you are at risk for hepatitis B infection. Hepatitis C Blood testing is recommended for: Everyone born from 1945 through 1965. Anyone with known risk factors for hepatitis C. Sexually transmitted infections (STIs) You should be screened each year for STIs, including gonorrhea and chlamydia, if: You are sexually active and are younger than 31 years of age. You are older than 31 years of age and your   health care provider tells you that you are at risk for this type of infection. Your sexual activity has changed since you were last screened, and you are at increased risk for chlamydia or gonorrhea. Ask your health care provider if you are at risk. Ask your health care provider about whether you are at high risk for HIV. Your health care provider  may recommend a prescription medicine to help prevent HIV infection. If you choose to take medicine to prevent HIV, you should first get tested for HIV. You should then be tested every 3 months for as long as you are taking the medicine. Follow these instructions at home: Alcohol use Do not drink alcohol if your health care provider tells you not to drink. If you drink alcohol: Limit how much you have to 0-2 drinks a day. Know how much alcohol is in your drink. In the [place] one drink equals one 12 oz bottle of beer (355 mL), one 5 oz glass of wine (148 mL), or one 1 oz glass of hard liquor (44 mL). Lifestyle Do not use any products that contain nicotine or tobacco. These products include cigarettes, chewing tobacco, and vaping devices, such as e-cigarettes. If you need help quitting, ask your health care provider. Do not use street drugs. Do not share needles. Ask your health care provider for help if you need support or information about quitting drugs. General instructions Schedule regular health, dental, and eye exams. Stay current with your vaccines. Tell your health care provider if: You often feel depressed. You have ever been abused or do not feel safe at home. Summary Adopting a healthy lifestyle and getting preventive care are important in promoting health and wellness. Follow your health care provider's instructions about healthy diet, exercising, and getting tested or screened for diseases. Follow your health care provider's instructions on monitoring your cholesterol and blood pressure. This information is not intended to replace advice given to you by your health care provider. Make sure you discuss any questions you have with your health care provider. Document Revised: 11/29/2020 Document Reviewed: 11/29/2020 Elsevier Patient Education  2023 [employer]  

## 2022-04-07 NOTE — Progress Notes (Signed)
Subjective:    Patient ID: Lawrence Mendoza, male    DOB: August 04, 1990, 31 y.o.   MRN: [id number]  Chief Complaint  Patient presents with   Annual Exam    HPI Pt presents to the office today for CPE. Pt denies any headache, palpitations, SOB, or edema at this time.    Reports he is trying eat healthier and working out 4 times a week at the gym. He has lost 12 lbs since his last visit.     He continues to vape daily.   He stopped his methadone since 06/2019, and been opiate free.    Review of Systems  All other systems reviewed and are negative.  Family History  Problem Relation Age of Onset   Hyperlipidemia Father    Hypertension Father    Hyperlipidemia Paternal Grandfather    Social History   Socioeconomic History   Marital status: Single    Spouse name: Not on file   Number of children: Not on file   Years of education: Not on file   Highest education level: Not on file  Occupational History   Not on file  Tobacco Use   Smoking status: Some Days   Smokeless tobacco: Former  Scientific laboratory technician Use: Every day  Substance and Sexual Activity   Alcohol use: Yes    Comment: socially   Drug use: No   Sexual activity: Not on file  Other Topics Concern   Not on file  Social History Narrative   Single, no children.   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   Lives in [place], [place].   Smoked cigarettes for a few years on and off, quit for good 07/2010.   No alcohol or drug use.   Social Determinants of Health   Financial Resource Strain: Not on file  Food Insecurity: Not on file  Transportation Needs: Not on file  Physical Activity: Not on file  Stress: Not on file  Social Connections: Not on file       Objective:   Physical Exam Vitals reviewed.  Constitutional:      General: He is not in acute distress.    Appearance: He is well-developed.  HENT:     Head: Normocephalic.     Right Ear: Tympanic membrane normal.      Left Ear: Tympanic membrane normal.  Eyes:     General:        Right eye: No discharge.        Left eye: No discharge.     Pupils: Pupils are equal, round, and reactive to light.  Neck:     Thyroid: No thyromegaly.  Cardiovascular:     Rate and Rhythm: Normal rate and regular rhythm.     Heart sounds: Normal heart sounds. No murmur heard. Pulmonary:     Effort: Pulmonary effort is normal. No respiratory distress.     Breath sounds: Normal breath sounds. No wheezing.  Abdominal:     General: Bowel sounds are normal. There is no distension.     Palpations: Abdomen is soft.     Tenderness: There is no abdominal tenderness.  Musculoskeletal:        General: No tenderness. Normal range of motion.     Cervical back: Normal range of motion and neck supple.  Skin:    General: Skin is warm and dry.     Findings: No erythema or rash.  Neurological:     Mental Status:  He is alert and oriented to person, place, and time.     Cranial Nerves: No cranial nerve deficit.     Deep Tendon Reflexes: Reflexes are normal and symmetric.  Psychiatric:        Behavior: Behavior normal.        Thought Content: Thought content normal.        Judgment: Judgment normal.       BP 133/81   Pulse 75   Temp 98.2 F (36.8 C) (Temporal)   Ht $R'5\' 8"'il$  (1.727 m)   Wt 171 lb 12.8 oz (77.9 kg)   BMI 26.12 kg/m      Assessment & Plan:  Lawrence Mendoza comes in today with chief complaint of Annual Exam   Diagnosis and orders addressed:  1. Annual physical exam - CMP14+EGFR - CBC with Differential/Platelet - Lipid panel - TSH  2. Vapes nicotine containing substance - CMP14+EGFR - CBC with Differential/Platelet   Labs pending Health Maintenance reviewed Diet and exercise encouraged  Follow up plan: 1 year    Evelina Dun, FNP

## 2022-04-08 LAB — CBC WITH DIFFERENTIAL/PLATELET
Basophils Absolute: 0.1 10*3/uL (ref 0.0–0.2)
Basos: 1 %
EOS (ABSOLUTE): 0.1 10*3/uL (ref 0.0–0.4)
Eos: 1 %
Hematocrit: 43.9 % (ref 37.5–51.0)
Hemoglobin: 14.9 g/dL (ref 13.0–17.7)
Immature Grans (Abs): 0 10*3/uL (ref 0.0–0.1)
Immature Granulocytes: 0 %
Lymphocytes Absolute: 2.4 10*3/uL (ref 0.7–3.1)
Lymphs: 31 %
MCH: 29.3 pg (ref 26.6–33.0)
MCHC: 33.9 g/dL (ref 31.5–35.7)
MCV: 86 fL (ref 79–97)
Monocytes Absolute: 0.7 10*3/uL (ref 0.1–0.9)
Monocytes: 9 %
Neutrophils Absolute: 4.6 10*3/uL (ref 1.4–7.0)
Neutrophils: 58 %
Platelets: 255 10*3/uL (ref 150–450)
RBC: 5.08 x10E6/uL (ref 4.14–5.80)
RDW: 12.3 % (ref 11.6–15.4)
WBC: 7.9 10*3/uL (ref 3.4–10.8)

## 2022-04-08 LAB — CMP14+EGFR
ALT: 14 IU/L (ref 0–44)
AST: 18 IU/L (ref 0–40)
Albumin/Globulin Ratio: 2.5 — ABNORMAL HIGH (ref 1.2–2.2)
Albumin: 4.8 g/dL (ref 4.1–5.1)
Alkaline Phosphatase: 78 IU/L (ref 44–121)
BUN/Creatinine Ratio: 12 (ref 9–20)
BUN: 11 mg/dL (ref 6–20)
Bilirubin Total: 0.5 mg/dL (ref 0.0–1.2)
CO2: 20 mmol/L (ref 20–29)
Calcium: 9.8 mg/dL (ref 8.7–10.2)
Chloride: 100 mmol/L (ref 96–106)
Creatinine, Ser: 0.95 mg/dL (ref 0.76–1.27)
Globulin, Total: 1.9 g/dL (ref 1.5–4.5)
Glucose: 75 mg/dL (ref 70–99)
Potassium: 3.9 mmol/L (ref 3.5–5.2)
Sodium: 140 mmol/L (ref 134–144)
Total Protein: 6.7 g/dL (ref 6.0–8.5)
eGFR: 110 mL/min/{1.73_m2} (ref >59)

## 2022-04-08 LAB — LIPID PANEL
Chol/HDL Ratio: 3.3 ratio (ref 0.0–5.0)
Cholesterol, Total: 163 mg/dL (ref 100–199)
HDL: 50 mg/dL (ref >39)
LDL Chol Calc (NIH): 103 mg/dL — ABNORMAL HIGH (ref 0–99)
Triglycerides: 47 mg/dL (ref 0–149)
VLDL Cholesterol Cal: 10 mg/dL (ref 5–40)

## 2022-04-08 LAB — TSH: TSH: 1.98 u[IU]/mL (ref 0.450–4.500)

## 2023-04-10 ENCOUNTER — Ambulatory Visit (INDEPENDENT_AMBULATORY_CARE_PROVIDER_SITE_OTHER): Payer: BC Managed Care – PPO | Admitting: Family

## 2023-04-10 ENCOUNTER — Encounter: Payer: Self-pay | Admitting: Family

## 2023-04-10 VITALS — BP 125/83 | HR 73 | Temp 97.4°F | Ht 68.0 in | Wt 174.6 lb

## 2023-04-10 DIAGNOSIS — Z720 Tobacco use: Secondary | ICD-10-CM | POA: Diagnosis not present

## 2023-04-10 DIAGNOSIS — Z0000 Encounter for general adult medical examination without abnormal findings: Secondary | ICD-10-CM | POA: Diagnosis not present

## 2023-04-10 NOTE — Progress Notes (Signed)
Subjective:    Patient ID: Lawrence Mendoza, male    DOB: 08/23/1990, 32 y.o.   MRN: [id number]  Chief Complaint  Patient presents with   Annual Exam    HPI Pt presents to the office today for CPE. Pt denies any headache, palpitations, SOB, or edema at this time.    Reports he is trying eat healthier and working out 2 times a week at the gym.   He continues to vape daily.    Review of Systems  All other systems reviewed and are negative.      Objective:   Physical Exam Vitals reviewed.  Constitutional:      General: He is not in acute distress.    Appearance: He is well-developed.  HENT:     Head: Normocephalic.     Right Ear: Tympanic membrane normal.     Left Ear: Tympanic membrane normal.  Eyes:     General:        Right eye: No discharge.        Left eye: No discharge.     Pupils: Pupils are equal, round, and reactive to light.  Neck:     Thyroid: No thyromegaly.  Cardiovascular:     Rate and Rhythm: Normal rate and regular rhythm.     Heart sounds: Normal heart sounds. No murmur heard. Pulmonary:     Effort: Pulmonary effort is normal. No respiratory distress.     Breath sounds: Normal breath sounds. No wheezing.  Abdominal:     General: Bowel sounds are normal. There is no distension.     Palpations: Abdomen is soft.     Tenderness: There is no abdominal tenderness.  Musculoskeletal:        General: No tenderness. Normal range of motion.     Cervical back: Normal range of motion and neck supple.  Skin:    General: Skin is warm and dry.     Findings: No erythema or rash.  Neurological:     Mental Status: He is alert and oriented to person, place, and time.     Cranial Nerves: No cranial nerve deficit.     Deep Tendon Reflexes: Reflexes are normal and symmetric.  Psychiatric:        Behavior: Behavior normal.        Thought Content: Thought content normal.        Judgment: Judgment normal.       BP 125/83   Pulse 73   Temp (!) 97.4 F (36.3  C) (Temporal)   Ht 5\' 8"  (1.727 m)   Wt 174 lb 9.6 oz (79.2 kg)   SpO2 97%   BMI 26.55 kg/m      Assessment & Plan:  NASHEED PACIFICO comes in today with chief complaint of Annual Exam   Diagnosis and orders addressed:  1. Annual physical exam - CMP14+EGFR - CBC with Differential/Platelet - Lipid panel - TSH  2. Vapes nicotine containing substance - CMP14+EGFR   Labs pending Health Maintenance reviewed Diet and exercise encouraged  Follow up plan: 1 year    Jannifer Rodney, FNP

## 2023-04-10 NOTE — Patient Instructions (Signed)
Health Maintenance, Male Adopting a healthy lifestyle and getting preventive care are important in promoting health and wellness. Ask your health care provider about: The right schedule for you to have regular tests and exams. Things you can do on your own to prevent diseases and keep yourself healthy. What should I know about diet, weight, and exercise? Eat a healthy diet  Eat a diet that includes plenty of vegetables, fruits, low-fat dairy products, and lean protein. Do not eat a lot of foods that are high in solid fats, added sugars, or sodium. Maintain a healthy weight Body mass index (BMI) is a measurement that can be used to identify possible weight problems. It estimates body fat based on height and weight. Your health care provider can help determine your BMI and help you achieve or maintain a healthy weight. Get regular exercise Get regular exercise. This is one of the most important things you can do for your health. Most adults should: Exercise for at least 150 minutes each week. The exercise should increase your heart rate and make you sweat (moderate-intensity exercise). Do strengthening exercises at least twice a week. This is in addition to the moderate-intensity exercise. Spend less time sitting. Even light physical activity can be beneficial. Watch cholesterol and blood lipids Have your blood tested for lipids and cholesterol at 32 years of age, then have this test every 5 years. You may need to have your cholesterol levels checked more often if: Your lipid or cholesterol levels are high. You are older than 32 years of age. You are at high risk for heart disease. What should I know about cancer screening? Many types of cancers can be detected early and may often be prevented. Depending on your health history and family history, you may need to have cancer screening at various ages. This may include screening for: Colorectal cancer. Prostate cancer. Skin cancer. Lung  cancer. What should I know about heart disease, diabetes, and high blood pressure? Blood pressure and heart disease High blood pressure causes heart disease and increases the risk of stroke. This is more likely to develop in people who have high blood pressure readings or are overweight. Talk with your health care provider about your target blood pressure readings. Have your blood pressure checked: Every 3-5 years if you are 61-40 years of age. Every year if you are 76 years old or older. If you are between the ages of 65 and 37 and are a current or former smoker, ask your health care provider if you should have a one-time screening for abdominal aortic aneurysm (AAA). Diabetes Have regular diabetes screenings. This checks your fasting blood sugar level. Have the screening done: Once every three years after age 80 if you are at a normal weight and have a low risk for diabetes. More often and at a younger age if you are overweight or have a high risk for diabetes. What should I know about preventing infection? Hepatitis B If you have a higher risk for hepatitis B, you should be screened for this virus. Talk with your health care provider to find out if you are at risk for hepatitis B infection. Hepatitis C Blood testing is recommended for: Everyone born from [birth year] through 1965. Anyone with known risk factors for hepatitis C. Sexually transmitted infections (STIs) You should be screened each year for STIs, including gonorrhea and chlamydia, if: You are sexually active and are younger than 32 years of age. You are older than 32 years of age and your  health care provider tells you that you are at risk for this type of infection. Your sexual activity has changed since you were last screened, and you are at increased risk for chlamydia or gonorrhea. Ask your health care provider if you are at risk. Ask your health care provider about whether you are at high risk for HIV. Your health care provider  may recommend a prescription medicine to help prevent HIV infection. If you choose to take medicine to prevent HIV, you should first get tested for HIV. You should then be tested every 3 months for as long as you are taking the medicine. Follow these instructions at home: Alcohol use Do not drink alcohol if your health care provider tells you not to drink. If you drink alcohol: Limit how much you have to 0-2 drinks a day. Know how much alcohol is in your drink. In the [place] one drink equals one 12 oz bottle of beer (355 mL), one 5 oz glass of wine (148 mL), or one 1 oz glass of hard liquor (44 mL). Lifestyle Do not use any products that contain nicotine or tobacco. These products include cigarettes, chewing tobacco, and vaping devices, such as e-cigarettes. If you need help quitting, ask your health care provider. Do not use street drugs. Do not share needles. Ask your health care provider for help if you need support or information about quitting drugs. General instructions Schedule regular health, dental, and eye exams. Stay current with your vaccines. Tell your health care provider if: You often feel depressed. You have ever been abused or do not feel safe at home. Summary Adopting a healthy lifestyle and getting preventive care are important in promoting health and wellness. Follow your health care provider's instructions about healthy diet, exercising, and getting tested or screened for diseases. Follow your health care provider's instructions on monitoring your cholesterol and blood pressure. This information is not intended to replace advice given to you by your health care provider. Make sure you discuss any questions you have with your health care provider. Document Revised: 11/29/2020 Document Reviewed: 11/29/2020 Elsevier Patient Education  2024 [employer].

## 2023-04-11 LAB — CMP14+EGFR
ALT: 37 IU/L (ref 0–44)
AST: 21 IU/L (ref 0–40)
Albumin: 4.8 g/dL (ref 4.1–5.1)
Alkaline Phosphatase: 91 IU/L (ref 44–121)
BUN/Creatinine Ratio: 14 (ref 9–20)
BUN: 13 mg/dL (ref 6–20)
Bilirubin Total: 0.5 mg/dL (ref 0.0–1.2)
CO2: 24 mmol/L (ref 20–29)
Calcium: 9.8 mg/dL (ref 8.7–10.2)
Chloride: 98 mmol/L (ref 96–106)
Creatinine, Ser: 0.92 mg/dL (ref 0.76–1.27)
Globulin, Total: 2.4 g/dL (ref 1.5–4.5)
Glucose: 73 mg/dL (ref 70–99)
Potassium: 4 mmol/L (ref 3.5–5.2)
Sodium: 137 mmol/L (ref 134–144)
Total Protein: 7.2 g/dL (ref 6.0–8.5)
eGFR: 113 mL/min/{1.73_m2} (ref >59)

## 2023-04-11 LAB — LIPID PANEL
Chol/HDL Ratio: 2.7 ratio (ref 0.0–5.0)
Cholesterol, Total: 227 mg/dL — ABNORMAL HIGH (ref 100–199)
HDL: 84 mg/dL (ref >39)
LDL Chol Calc (NIH): 134 mg/dL — ABNORMAL HIGH (ref 0–99)
Triglycerides: 52 mg/dL (ref 0–149)
VLDL Cholesterol Cal: 9 mg/dL (ref 5–40)

## 2023-04-11 LAB — CBC WITH DIFFERENTIAL/PLATELET
Basophils Absolute: 0.1 10*3/uL (ref 0.0–0.2)
Basos: 0 %
EOS (ABSOLUTE): 0.1 10*3/uL (ref 0.0–0.4)
Eos: 1 %
Hematocrit: 46.7 % (ref 37.5–51.0)
Hemoglobin: 15.8 g/dL (ref 13.0–17.7)
Immature Grans (Abs): 0 10*3/uL (ref 0.0–0.1)
Immature Granulocytes: 0 %
Lymphocytes Absolute: 2.4 10*3/uL (ref 0.7–3.1)
Lymphs: 20 %
MCH: 29.3 pg (ref 26.6–33.0)
MCHC: 33.8 g/dL (ref 31.5–35.7)
MCV: 87 fL (ref 79–97)
Monocytes Absolute: 0.9 10*3/uL (ref 0.1–0.9)
Monocytes: 8 %
Neutrophils Absolute: 8.4 10*3/uL — ABNORMAL HIGH (ref 1.4–7.0)
Neutrophils: 71 %
Platelets: 275 10*3/uL (ref 150–450)
RBC: 5.4 x10E6/uL (ref 4.14–5.80)
RDW: 12 % (ref 11.6–15.4)
WBC: 11.9 10*3/uL — ABNORMAL HIGH (ref 3.4–10.8)

## 2023-04-11 LAB — TSH: TSH: 1.31 u[IU]/mL (ref 0.450–4.500)

## 2023-08-03 ENCOUNTER — Encounter: Payer: BC Managed Care – PPO | Admitting: Family

## 2023-09-27 ENCOUNTER — Encounter: Payer: BC Managed Care – PPO | Admitting: Family

## 2023-10-19 ENCOUNTER — Ambulatory Visit (INDEPENDENT_AMBULATORY_CARE_PROVIDER_SITE_OTHER): Payer: BC Managed Care – PPO | Admitting: Family

## 2023-10-19 ENCOUNTER — Encounter: Payer: Self-pay | Admitting: Family

## 2023-10-19 VITALS — BP 149/89 | HR 70 | Temp 98.5°F | Ht 68.0 in | Wt 183.2 lb

## 2023-10-19 DIAGNOSIS — Z720 Tobacco use: Secondary | ICD-10-CM | POA: Diagnosis not present

## 2023-10-19 DIAGNOSIS — R030 Elevated blood-pressure reading, without diagnosis of hypertension: Secondary | ICD-10-CM

## 2023-10-19 DIAGNOSIS — Z0001 Encounter for general adult medical examination with abnormal findings: Secondary | ICD-10-CM

## 2023-10-19 DIAGNOSIS — Z0000 Encounter for general adult medical examination without abnormal findings: Secondary | ICD-10-CM

## 2023-10-19 NOTE — Progress Notes (Signed)
   Subjective:    Patient ID: EGON DITTUS, male    DOB: [birth year], 33 y.o.   MRN: [id number]  Chief Complaint  Patient presents with   Annual Exam    HPI Pt presents to the office today for CPE. Pt denies any headache, palpitations, SOB, or edema at this time.    Recently got married.   He continues to vape daily.  His BP is elevated today, but states he has been stressed at work.     Review of Systems  All other systems reviewed and are negative.      Objective:   Physical Exam Vitals reviewed.  Constitutional:      General: He is not in acute distress.    Appearance: He is well-developed.  HENT:     Head: Normocephalic.     Right Ear: Tympanic membrane normal.     Left Ear: Tympanic membrane normal.  Eyes:     General:        Right eye: No discharge.        Left eye: No discharge.     Pupils: Pupils are equal, round, and reactive to light.  Neck:     Thyroid: No thyromegaly.  Cardiovascular:     Rate and Rhythm: Normal rate and regular rhythm.     Heart sounds: Normal heart sounds. No murmur heard. Pulmonary:     Effort: Pulmonary effort is normal. No respiratory distress.     Breath sounds: Normal breath sounds. No wheezing.  Abdominal:     General: Bowel sounds are normal. There is no distension.     Palpations: Abdomen is soft.     Tenderness: There is no abdominal tenderness.  Musculoskeletal:        General: No tenderness. Normal range of motion.     Cervical back: Normal range of motion and neck supple.  Skin:    General: Skin is warm and dry.     Findings: No erythema or rash.  Neurological:     Mental Status: He is alert and oriented to person, place, and time.     Cranial Nerves: No cranial nerve deficit.     Deep Tendon Reflexes: Reflexes are normal and symmetric.  Psychiatric:        Behavior: Behavior normal.        Thought Content: Thought content normal.        Judgment: Judgment normal.       BP (!) 142/94   Pulse 70   Temp  98.5 F (36.9 C) (Temporal)   Ht 5\' 8"  (1.727 m)   Wt 183 lb 3.2 oz (83.1 kg)   SpO2 98%   BMI 27.86 kg/m      Assessment & Plan:  DOMINGO FUSON comes in today with chief complaint of Annual Exam   Diagnosis and orders addressed:  1. Annual physical exam (Primary) - CMP14+EGFR - CBC with Differential/Platelet  2. Vapes nicotine containing substance Smoking cessation discussed   3. Elevated blood pressure reading PT will monitor at home  Want <140/90  -Daily blood pressure log given with instructions on how to fill out and told to bring to next visit -Dash diet information given -Exercise encouraged - Stress Management  -Continue current meds   Labs pending Monitor BP at home  Health Maintenance reviewed Diet and exercise encouraged  Follow up plan: 1 year    Jannifer Rodney, FNP

## 2023-10-19 NOTE — Patient Instructions (Addendum)
 Hypertension, Adult High blood pressure (hypertension) is when the force of blood pumping through the arteries is too strong. The arteries are the blood vessels that carry blood from the heart throughout the body. Hypertension forces the heart to work harder to pump blood and may cause arteries to become narrow or stiff. Untreated or uncontrolled hypertension can lead to a heart attack, heart failure, a stroke, kidney disease, and other problems. A blood pressure reading consists of a higher number over a lower number. Ideally, your blood pressure should be below 120/80. The first ("top") number is called the systolic pressure. It is a measure of the pressure in your arteries as your heart beats. The second ("bottom") number is called the diastolic pressure. It is a measure of the pressure in your arteries as the heart relaxes. What are the causes? The exact cause of this condition is not known. There are some conditions that result in high blood pressure. What increases the risk? Certain factors may make you more likely to develop high blood pressure. Some of these risk factors are under your control, including: Smoking. Not getting enough exercise or physical activity. Being overweight. Having too much fat, sugar, calories, or salt (sodium) in your diet. Drinking too much alcohol. Other risk factors include: Having a personal history of heart disease, diabetes, high cholesterol, or kidney disease. Stress. Having a family history of high blood pressure and high cholesterol. Having obstructive sleep apnea. Age. The risk increases with age. What are the signs or symptoms? High blood pressure may not cause symptoms. Very high blood pressure (hypertensive crisis) may cause: Headache. Fast or irregular heartbeats (palpitations). Shortness of breath. Nosebleed. Nausea and vomiting. Vision changes. Severe chest pain, dizziness, and seizures. How is this diagnosed? This condition is diagnosed by  measuring your blood pressure while you are seated, with your arm resting on a flat surface, your legs uncrossed, and your feet flat on the floor. The cuff of the blood pressure monitor will be placed directly against the skin of your upper arm at the level of your heart. Blood pressure should be measured at least twice using the same arm. Certain conditions can cause a difference in blood pressure between your right and left arms. If you have a high blood pressure reading during one visit or you have normal blood pressure with other risk factors, you may be asked to: Return on a different day to have your blood pressure checked again. Monitor your blood pressure at home for 1 week or longer. If you are diagnosed with hypertension, you may have other blood or imaging tests to help your health care provider understand your overall risk for other conditions. How is this treated? This condition is treated by making healthy lifestyle changes, such as eating healthy foods, exercising more, and reducing your alcohol intake. You may be referred for counseling on a healthy diet and physical activity. Your health care provider may prescribe medicine if lifestyle changes are not enough to get your blood pressure under control and if: Your systolic blood pressure is above 130. Your diastolic blood pressure is above 80. Your personal target blood pressure may vary depending on your medical conditions, your age, and other factors. Follow these instructions at home: Eating and drinking  Eat a diet that is high in fiber and potassium, and low in sodium, added sugar, and fat. An example of this eating plan is called the DASH diet. DASH stands for Dietary Approaches to Stop Hypertension. To eat this way: Eat  plenty of fresh fruits and vegetables. Try to fill one half of your plate at each meal with fruits and vegetables. Eat whole grains, such as whole-wheat pasta, brown rice, or whole-grain bread. Fill about one  fourth of your plate with whole grains. Eat or drink low-fat dairy products, such as skim milk or low-fat yogurt. Avoid fatty cuts of meat, processed or cured meats, and poultry with skin. Fill about one fourth of your plate with lean proteins, such as fish, chicken without skin, beans, eggs, or tofu. Avoid pre-made and processed foods. These tend to be higher in sodium, added sugar, and fat. Reduce your daily sodium intake. Many people with hypertension should eat less than 1,500 mg of sodium a day. Do not drink alcohol if: Your health care provider tells you not to drink. You are pregnant, may be pregnant, or are planning to become pregnant. If you drink alcohol: Limit how much you have to: 0-1 drink a day for women. 0-2 drinks a day for men. Know how much alcohol is in your drink. In the [place] one drink equals one 12 oz bottle of beer (355 mL), one 5 oz glass of wine (148 mL), or one 1 oz glass of hard liquor (44 mL). Lifestyle  Work with your health care provider to maintain a healthy body weight or to lose weight. Ask what an ideal weight is for you. Get at least 30 minutes of exercise that causes your heart to beat faster (aerobic exercise) most days of the week. Activities may include walking, swimming, or biking. Include exercise to strengthen your muscles (resistance exercise), such as Pilates or lifting weights, as part of your weekly exercise routine. Try to do these types of exercises for 30 minutes at least 3 days a week. Do not use any products that contain nicotine or tobacco. These products include cigarettes, chewing tobacco, and vaping devices, such as e-cigarettes. If you need help quitting, ask your health care provider. Monitor your blood pressure at home as told by your health care provider. Keep all follow-up visits. This is important. Medicines Take over-the-counter and prescription medicines only as told by your health care provider. Follow directions carefully. Blood  pressure medicines must be taken as prescribed. Do not skip doses of blood pressure medicine. Doing this puts you at risk for problems and can make the medicine less effective. Ask your health care provider about side effects or reactions to medicines that you should watch for. Contact a health care provider if you: Think you are having a reaction to a medicine you are taking. Have headaches that keep coming back (recurring). Feel dizzy. Have swelling in your ankles. Have trouble with your vision. Get help right away if you: Develop a severe headache or confusion. Have unusual weakness or numbness. Feel faint. Have severe pain in your chest or abdomen. Vomit repeatedly. Have trouble breathing. These symptoms may be an emergency. Get help right away. Call 911. Do not wait to see if the symptoms will go away. Do not drive yourself to the hospital. Summary Hypertension is when the force of blood pumping through your arteries is too strong. If this condition is not controlled, it may put you at risk for serious complications. Your personal target blood pressure may vary depending on your medical conditions, your age, and other factors. For most people, a normal blood pressure is less than 120/80. Hypertension is treated with lifestyle changes, medicines, or a combination of both. Lifestyle changes include losing weight, eating a healthy,  low-sodium diet, exercising more, and limiting alcohol. This information is not intended to replace advice given to you by your health care provider. Make sure you discuss any questions you have with your health care provider. Document Revised: 05/17/2021 Document Reviewed: 05/17/2021 Elsevier Patient Education  2024 [employer].

## 2023-10-20 LAB — CMP14+EGFR
ALT: 12 IU/L (ref 0–44)
AST: 13 IU/L (ref 0–40)
Albumin: 4.5 g/dL (ref 4.1–5.1)
Alkaline Phosphatase: 79 IU/L (ref 44–121)
BUN/Creatinine Ratio: 13 (ref 9–20)
BUN: 14 mg/dL (ref 6–20)
Bilirubin Total: 0.3 mg/dL (ref 0.0–1.2)
CO2: 22 mmol/L (ref 20–29)
Calcium: 9.5 mg/dL (ref 8.7–10.2)
Chloride: 100 mmol/L (ref 96–106)
Creatinine, Ser: 1.1 mg/dL (ref 0.76–1.27)
Globulin, Total: 2.1 g/dL (ref 1.5–4.5)
Glucose: 91 mg/dL (ref 70–99)
Potassium: 3.9 mmol/L (ref 3.5–5.2)
Sodium: 139 mmol/L (ref 134–144)
Total Protein: 6.6 g/dL (ref 6.0–8.5)
eGFR: 91 mL/min/{1.73_m2} (ref >59)

## 2023-10-20 LAB — CBC WITH DIFFERENTIAL/PLATELET
Basophils Absolute: 0.1 10*3/uL (ref 0.0–0.2)
Basos: 1 %
EOS (ABSOLUTE): 0.1 10*3/uL (ref 0.0–0.4)
Eos: 1 %
Hematocrit: 43.2 % (ref 37.5–51.0)
Hemoglobin: 14.5 g/dL (ref 13.0–17.7)
Immature Grans (Abs): 0 10*3/uL (ref 0.0–0.1)
Immature Granulocytes: 0 %
Lymphocytes Absolute: 2.6 10*3/uL (ref 0.7–3.1)
Lymphs: 30 %
MCH: 29.2 pg (ref 26.6–33.0)
MCHC: 33.6 g/dL (ref 31.5–35.7)
MCV: 87 fL (ref 79–97)
Monocytes Absolute: 0.8 10*3/uL (ref 0.1–0.9)
Monocytes: 9 %
Neutrophils Absolute: 5.2 10*3/uL (ref 1.4–7.0)
Neutrophils: 59 %
Platelets: 273 10*3/uL (ref 150–450)
RBC: 4.96 x10E6/uL (ref 4.14–5.80)
RDW: 12 % (ref 11.6–15.4)
WBC: 8.9 10*3/uL (ref 3.4–10.8)

## 2024-10-20 ENCOUNTER — Encounter: Payer: Self-pay | Admitting: Family
